# Patient Record
Sex: Male | Born: 1970 | Race: Black or African American | Hispanic: No | Marital: Single | Smoking: Former smoker
Health system: Southern US, Community
[De-identification: ages and names within clinical notes are randomized; demographics above are authoritative.]

---

## 2012-01-24 ENCOUNTER — Encounter (HOSPITAL_COMMUNITY): Payer: Self-pay | Admitting: Emergency Medicine

## 2012-01-24 ENCOUNTER — Emergency Department (HOSPITAL_COMMUNITY)
Admission: EM | Admit: 2012-01-24 | Discharge: 2012-01-24 | Disposition: A | Discharge status: Home / Self Care | Payer: Self-pay | Attending: Emergency Medicine | Admitting: Emergency Medicine

## 2012-01-24 DIAGNOSIS — M549 Dorsalgia, unspecified: Secondary | ICD-10-CM | POA: Insufficient documentation

## 2012-01-24 DIAGNOSIS — R209 Unspecified disturbances of skin sensation: Secondary | ICD-10-CM | POA: Insufficient documentation

## 2012-01-24 MED ORDER — OXYCODONE-ACETAMINOPHEN 5-325 MG PO TABS: 2.0000 | ORAL_TABLET | ORAL | Status: DC | PRN

## 2012-01-24 MED ORDER — IBUPROFEN 600 MG PO TABS: 600.0000 mg | ORAL_TABLET | Freq: Four times a day (QID) | ORAL | Status: DC | PRN

## 2012-01-24 NOTE — ED Notes (Signed)
Pt states that he picked up a suitcase last week and is still having lower back pain. Also states that he hasn't been sleeping well. Hx of back problems.

## 2012-01-24 NOTE — ED Notes (Signed)
Pt picked up suitcase last week and threw back out. Pt has been taking ibuprofen for pain with no results. Pt complains if he sits to long pain becomes worse. Pt complains of pain across lower part of back. Pt has hx of back pain. In the past it has taken a week to have back feel better.

## 2012-01-24 NOTE — ED Provider Notes (Signed)
Medical screening examination/treatment/procedure(s) were performed by non-physician practitioner and as supervising physician I was immediately available for consultation/collaboration.   Gwyneth Sprout, MD 01/24/12 [phone]

## 2012-01-24 NOTE — ED Provider Notes (Signed)
History     CSN: [id number]  Arrival date & time 01/24/12  1327   First MD Initiated Contact with Patient 01/24/12 1707      Chief Complaint  Patient presents with  . Back Pain    (Consider location/radiation/quality/duration/timing/severity/associated sxs/prior treatment) Patient is a 41 y.o. male presenting with back pain. The history is provided by the patient. No language interpreter was used.  Back Pain  This is a new problem. The current episode started more than 2 days ago. The problem has not changed since onset.The pain is associated with lifting heavy objects. Associated symptoms include numbness. Pertinent negatives include no weakness.   bilateral lumbar  paraspinal pain since last week when he lifted a suitcase.  Patient complains of pain 8/10 today that has not worsened. States that  The pain has gotten better but he had to stay out of work today because it hurts in bed. States that he has taken over-the-counter medications like ibuprofen with some relief. No bowel or bladder incontinence no radiation of pain. States that he injured his back before in the same way and it took a week to get better. Patient has no PCP.  History reviewed. No pertinent past medical history.  History reviewed. No pertinent past surgical history.  No family history on file.  History  Substance Use Topics  . Smoking status: Never Smoker   . Smokeless tobacco: Not on file  . Alcohol Use: No      Review of Systems  Constitutional: Negative.   HENT: Negative.   Eyes: Negative.   Respiratory: Negative.   Cardiovascular: Negative.   Gastrointestinal: Negative.  Negative for nausea and vomiting.  Genitourinary: Negative for difficulty urinating.  Musculoskeletal: Positive for back pain.  Neurological: Positive for numbness. Negative for dizziness and weakness.  Psychiatric/Behavioral: Negative.   All other systems reviewed and are negative.    Allergies  Review of patient's  allergies indicates no known allergies.  Home Medications  No current outpatient prescriptions on file.  BP 129/84  Pulse 95  Temp 98.1 F (36.7 C)  Resp 16  SpO2 100%  Physical Exam  Nursing note and vitals reviewed. Constitutional: He is oriented to person, place, and time. He appears well-developed and well-nourished.  HENT:  Head: Normocephalic.  Eyes: Conjunctivae normal and EOM are normal. Pupils are equal, round, and reactive to light.  Neck: Normal range of motion. Neck supple.  Cardiovascular: Normal rate.   Pulmonary/Chest: Effort normal.  Abdominal: Soft.  Musculoskeletal: Normal range of motion.  Neurological: He is alert and oriented to person, place, and time. He has normal strength and normal reflexes. No cranial nerve deficit or sensory deficit. He displays a negative Romberg sign. Coordination normal. GCS eye subscore is 4. GCS verbal subscore is 5. GCS motor subscore is 6.  Skin: Skin is warm and dry.  Psychiatric: He has a normal mood and affect.    ED Course  Procedures (including critical care time)  Labs Reviewed - No data to display No results found.   No diagnosis found.    MDM  Bilateral lower back pain since lifting a suitcase last week with no radiation of pain. No cauda equina symptoms or red flags. Patient understands to return for worsening symptoms. Followup with PCP of choice.        Remi Haggard, NP 01/24/12 1842

## 2012-04-17 ENCOUNTER — Emergency Department (HOSPITAL_COMMUNITY)
Admission: EM | Admit: 2012-04-17 | Discharge: 2012-04-17 | Disposition: A | Discharge status: Home / Self Care | Payer: Self-pay | Attending: Emergency Medicine | Admitting: Emergency Medicine

## 2012-04-17 ENCOUNTER — Encounter (HOSPITAL_COMMUNITY): Payer: Self-pay

## 2012-04-17 DIAGNOSIS — R042 Hemoptysis: Secondary | ICD-10-CM | POA: Insufficient documentation

## 2012-04-17 DIAGNOSIS — Z87891 Personal history of nicotine dependence: Secondary | ICD-10-CM | POA: Insufficient documentation

## 2012-04-17 NOTE — ED Provider Notes (Addendum)
History     CSN: [id number]  Arrival date & time 04/17/12  4098   First MD Initiated Contact with Patient 04/17/12 [phone]      Chief Complaint  Patient presents with  . Hemoptysis    (Consider location/radiation/quality/duration/timing/severity/associated sxs/prior treatment) HPI Comments: Patient woke from sleep this morning to go to the restroom.  He then spit into the toilet and noted that there was a small amount of dark blood.  He did not think much of it, but when he told his family, they insisted he come here to be evaluated.  He denies any fevers, chills, congestion, cough, etc.  He otherwise feels fine.  This has never happened before.  The history is provided by the patient.    History reviewed. No pertinent past medical history.  History reviewed. No pertinent past surgical history.  Family History  Problem Relation Age of Onset  . Diabetes Mother   . Hypertension Mother   . Thyroid disease Mother     History  Substance Use Topics  . Smoking status: Former Games developer  . Smokeless tobacco: Never Used  . Alcohol Use: No      Review of Systems  All other systems reviewed and are negative.    Allergies  Review of patient's allergies indicates no known allergies.  Home Medications   Current Outpatient Rx  Name  Route  Sig  Dispense  Refill  . IBUPROFEN 600 MG PO TABS   Oral   Take 1 tablet (600 mg total) by mouth every 6 (six) hours as needed for pain.   30 tablet   0   . OXYCODONE-ACETAMINOPHEN 5-325 MG PO TABS   Oral   Take 2 tablets by mouth every 4 (four) hours as needed for pain.   15 tablet   0     BP 141/88  Pulse 91  Temp 98.4 F (36.9 C) (Oral)  Resp 18  SpO2 100%  Physical Exam  Nursing note and vitals reviewed. Constitutional: He is oriented to person, place, and time. He appears well-developed and well-nourished. No distress.  HENT:  Head: Normocephalic and atraumatic.  Nose: Nose normal.  Mouth/Throat: Oropharynx is clear and  moist. No oropharyngeal exudate.       Bilateral tm's clear.  No maxillofacial ttp.  Neck: Normal range of motion. Neck supple.  Cardiovascular: Normal rate and regular rhythm.   No murmur heard. Pulmonary/Chest: Effort normal and breath sounds normal. No respiratory distress.  Lymphadenopathy:    He has no cervical adenopathy.  Neurological: He is alert and oriented to person, place, and time.  Skin: Skin is warm and dry. He is not diaphoretic.    ED Course  Procedures (including critical care time)  Labs Reviewed - No data to display No results found.   No diagnosis found.    MDM  He appears well with no complaints.  I am unable to visualize any bleeding or abnormality on the physical exam.  I suspect that this is likely related to dry mucous membranes and I have recommended using a humidifier at night.  He is to return to the ER if symptoms worsen and follow up with his pcp to discuss ENT exam if symptoms persist.  No emergent condition is apparent.  There is no chest pain or shortness of breath and nothing in the vitals or exam to suggest pe.        Geoffery Lyons, MD 04/17/12 4782  Geoffery Lyons, MD 04/17/12 [phone]

## 2012-04-17 NOTE — ED Notes (Signed)
Patient states he saw blood when he spit last night.  Patient denies any other associated symptoms.

## 2012-04-17 NOTE — ED Notes (Signed)
Patient reports that he had a medium amount of dark blood when he spit. Patient  Denies this happening when he coughed.

## 2012-09-11 ENCOUNTER — Emergency Department (HOSPITAL_COMMUNITY)
Admission: EM | Admit: 2012-09-11 | Discharge: 2012-09-11 | Disposition: A | Discharge status: Home / Self Care | Payer: Self-pay | Attending: Emergency Medicine | Admitting: Emergency Medicine

## 2012-09-11 ENCOUNTER — Encounter (HOSPITAL_COMMUNITY): Payer: Self-pay | Admitting: *Deleted

## 2012-09-11 DIAGNOSIS — Z87891 Personal history of nicotine dependence: Secondary | ICD-10-CM | POA: Insufficient documentation

## 2012-09-11 DIAGNOSIS — R112 Nausea with vomiting, unspecified: Secondary | ICD-10-CM | POA: Insufficient documentation

## 2012-09-11 MED ORDER — ONDANSETRON 8 MG PO TBDP
8.0000 mg | ORAL_TABLET | Freq: Once | ORAL | Status: AC
  Administered 2012-09-11: 8 mg via ORAL
  Filled 2012-09-11: qty 1

## 2012-09-11 MED ORDER — ONDANSETRON HCL 4 MG/2ML IJ SOLN
4.0000 mg | Freq: Once | INTRAMUSCULAR | Status: DC
  Filled 2012-09-11: qty 2

## 2012-09-11 MED ORDER — SODIUM CHLORIDE 0.9 % IV SOLN: 1000.0000 mL | INTRAVENOUS | Status: DC

## 2012-09-11 MED ORDER — ONDANSETRON 8 MG PO TBDP: 8.0000 mg | ORAL_TABLET | Freq: Three times a day (TID) | ORAL | Status: DC | PRN

## 2012-09-11 MED ORDER — SODIUM CHLORIDE 0.9 % IV SOLN: 1000.0000 mL | Freq: Once | INTRAVENOUS | Status: DC

## 2012-09-11 NOTE — ED Provider Notes (Signed)
History     CSN: [id number]  Arrival date & time 09/11/12  7829   First MD Initiated Contact with Patient 09/11/12 [phone]      Chief Complaint  Patient presents with  . Emesis     The history is provided by the patient.   patient reports developing nausea vomiting since last night.  No hematemesis.  No melena or hematochezia.  2 loose stools.  No prior history of similar.  Denies crampy abdominal pain.  No fevers or chills.  No sore throat.  No cough or congestion.  No shortness of breath.  Symptoms are mild to moderate in severity.  Try to drink orange juice this morning and was unable to keep it down.  History reviewed. No pertinent past medical history.  History reviewed. No pertinent past surgical history.  Family History  Problem Relation Age of Onset  . Diabetes Mother   . Hypertension Mother   . Thyroid disease Mother     History  Substance Use Topics  . Smoking status: Former Games developer  . Smokeless tobacco: Never Used  . Alcohol Use: No      Review of Systems  Gastrointestinal: Positive for vomiting.  All other systems reviewed and are negative.    Allergies  Review of patient's allergies indicates no known allergies.  Home Medications   Current Outpatient Rx  Name  Route  Sig  Dispense  Refill  . ibuprofen (ADVIL,MOTRIN) 600 MG tablet   Oral   Take 1 tablet (600 mg total) by mouth every 6 (six) hours as needed for pain.   30 tablet   0   . oxyCODONE-acetaminophen (PERCOCET/ROXICET) 5-325 MG per tablet   Oral   Take 2 tablets by mouth every 4 (four) hours as needed for pain.   15 tablet   0     BP 130/89  Pulse 87  Temp(Src) 98 F (36.7 C) (Temporal)  Resp 18  Ht 5\' 10"  (1.778 m)  Wt 41 lb 4 oz (18.711 kg)  BMI 5.92 kg/m2  SpO2 100%  Physical Exam  Nursing note and vitals reviewed. Constitutional: He is oriented to person, place, and time. He appears well-developed and well-nourished.  HENT:  Head: Normocephalic and atraumatic.  Eyes:  EOM are normal.  Neck: Normal range of motion.  Cardiovascular: Normal rate, regular rhythm, normal heart sounds and intact distal pulses.   Pulmonary/Chest: Effort normal and breath sounds normal. No respiratory distress.  Abdominal: Soft. He exhibits no distension. There is no tenderness.  Genitourinary: Rectum normal.  Musculoskeletal: Normal range of motion.  Neurological: He is alert and oriented to person, place, and time.  Skin: Skin is warm and dry.  Psychiatric: He has a normal mood and affect. Judgment normal.    ED Course  Procedures (including critical care time)  Labs Reviewed - No data to display No results found.   1. Nausea & vomiting       MDM  8:05 AM Patient does not want IV fluids or IV Zofran.  He requests oral Zofran discharge home.  DC home in good condition.  Abdominal exam is benign.  Vital signs are normal.        Lyanne Co, MD 09/11/12 [phone]

## 2012-09-11 NOTE — ED Notes (Signed)
PT c/o n/v x 2 yesterday and x 1 this morning.

## 2014-02-27 ENCOUNTER — Encounter (HOSPITAL_COMMUNITY): Payer: Self-pay

## 2014-02-27 ENCOUNTER — Emergency Department (HOSPITAL_COMMUNITY)
Admission: EM | Admit: 2014-02-27 | Discharge: 2014-02-27 | Disposition: A | Discharge status: Home / Self Care | Payer: Self-pay | Attending: Emergency Medicine | Admitting: Emergency Medicine

## 2014-02-27 DIAGNOSIS — Z791 Long term (current) use of non-steroidal anti-inflammatories (NSAID): Secondary | ICD-10-CM | POA: Insufficient documentation

## 2014-02-27 DIAGNOSIS — J069 Acute upper respiratory infection, unspecified: Secondary | ICD-10-CM | POA: Insufficient documentation

## 2014-02-27 DIAGNOSIS — Z87891 Personal history of nicotine dependence: Secondary | ICD-10-CM | POA: Insufficient documentation

## 2014-02-27 MED ORDER — OXYMETAZOLINE HCL 0.05 % NA SOLN: 1.0000 | Freq: Two times a day (BID) | NASAL | Status: DC

## 2014-02-27 MED ORDER — HYDROCOD POLST-CHLORPHEN POLST 10-8 MG/5ML PO LQCR: 5.0000 mL | Freq: Two times a day (BID) | ORAL | Status: DC | PRN

## 2014-02-27 NOTE — ED Notes (Signed)
Pt presents with c/o cough and nasal congestion for the past 2 days. Pt denies any abdominal pain or vomiting. Pt reports some intermittent weakness with his symptoms but denies weakness at this time.

## 2014-02-27 NOTE — ED Notes (Signed)
Initial Contact - pt reports cough/cold symp x3-4 days, "feeling terrible", denies cp/sob, speaking full/clear sentences, rr even/un-lab.  C/o 4/10 headache at this time.  Skin PWD.  NAD.

## 2014-02-27 NOTE — Discharge Instructions (Signed)
Read the information below.  Use the prescribed medication as directed.  Please discuss all new medications with your pharmacist.  You may return to the Emergency Department at any time for worsening condition or any new symptoms that concern you.  If you develop high fevers that do not resolve with tylenol or ibuprofen, you have difficulty swallowing or breathing, or you are unable to tolerate fluids by mouth, return to the ER for a recheck.    ° ° °Upper Respiratory Infection, Adult °An upper respiratory infection (URI) is also sometimes known as the common cold. The upper respiratory tract includes the nose, sinuses, throat, trachea, and bronchi. Bronchi are the airways leading to the lungs. Most people improve within 1 week, but symptoms can last up to 2 weeks. A residual cough may last even longer.  °CAUSES °Many different viruses can infect the tissues lining the upper respiratory tract. The tissues become irritated and inflamed and often become very moist. Mucus production is also common. A cold is contagious. You can easily spread the virus to others by oral contact. This includes kissing, sharing a glass, coughing, or sneezing. Touching your mouth or nose and then touching a surface, which is then touched by another person, can also spread the virus. °SYMPTOMS  °Symptoms typically develop 1 to 3 days after you come in contact with a cold virus. Symptoms vary from person to person. They may include: °· Runny nose. °· Sneezing. °· Nasal congestion. °· Sinus irritation. °· Sore throat. °· Loss of voice (laryngitis). °· Cough. °· Fatigue. °· Muscle aches. °· Loss of appetite. °· Headache. °· Low-grade fever. °DIAGNOSIS  °You might diagnose your own cold based on familiar symptoms, since most people get a cold 2 to 3 times a year. Your caregiver can confirm this based on your exam. Most importantly, your caregiver can check that your symptoms are not due to another disease such as strep throat, sinusitis,  pneumonia, asthma, or epiglottitis. Blood tests, throat tests, and X-rays are not necessary to diagnose a common cold, but they may sometimes be helpful in excluding other more serious diseases. Your caregiver will decide if any further tests are required. °RISKS AND COMPLICATIONS  °You may be at risk for a more severe case of the common cold if you smoke cigarettes, have chronic heart disease (such as heart failure) or lung disease (such as asthma), or if you have a weakened immune system. The very young and very old are also at risk for more serious infections. Bacterial sinusitis, middle ear infections, and bacterial pneumonia can complicate the common cold. The common cold can worsen asthma and chronic obstructive pulmonary disease (COPD). Sometimes, these complications can require emergency medical care and may be life-threatening. °PREVENTION  °The best way to protect against getting a cold is to practice good hygiene. Avoid oral or hand contact with people with cold symptoms. Wash your hands often if contact occurs. There is no clear evidence that vitamin C, vitamin E, echinacea, or exercise reduces the chance of developing a cold. However, it is always recommended to get plenty of rest and practice good nutrition. °TREATMENT  °Treatment is directed at relieving symptoms. There is no cure. Antibiotics are not effective, because the infection is caused by a virus, not by bacteria. Treatment may include: °· Increased fluid intake. Sports drinks offer valuable electrolytes, sugars, and fluids. °· Breathing heated mist or steam (vaporizer or shower). °· Eating chicken soup or other clear broths, and maintaining good nutrition. °· Getting plenty   of rest. °· Using gargles or lozenges for comfort. °· Controlling fevers with ibuprofen or acetaminophen as directed by your caregiver. °· Increasing usage of your inhaler if you have asthma. °Zinc gel and zinc lozenges, taken in the first 24 hours of the common cold, can  shorten the duration and lessen the severity of symptoms. Pain medicines may help with fever, muscle aches, and throat pain. A variety of non-prescription medicines are available to treat congestion and runny nose. Your caregiver can make recommendations and may suggest nasal or lung inhalers for other symptoms.  °HOME CARE INSTRUCTIONS  °· Only take over-the-counter or prescription medicines for pain, discomfort, or fever as directed by your caregiver. °· Use a warm mist humidifier or inhale steam from a shower to increase air moisture. This may keep secretions moist and make it easier to breathe. °· Drink enough water and fluids to keep your urine clear or pale yellow. °· Rest as needed. °· Return to work when your temperature has returned to normal or as your caregiver advises. You may need to stay home longer to avoid infecting others. You can also use a face mask and careful hand washing to prevent spread of the virus. °SEEK MEDICAL CARE IF:  °· After the first few days, you feel you are getting worse rather than better. °· You need your caregiver's advice about medicines to control symptoms. °· You develop chills, worsening shortness of breath, or brown or red sputum. These may be signs of pneumonia. °· You develop yellow or brown nasal discharge or pain in the face, especially when you bend forward. These may be signs of sinusitis. °· You develop a fever, swollen neck glands, pain with swallowing, or white areas in the back of your throat. These may be signs of strep throat. °SEEK IMMEDIATE MEDICAL CARE IF:  °· You have a fever. °· You develop severe or persistent headache, ear pain, sinus pain, or chest pain. °· You develop wheezing, a prolonged cough, cough up blood, or have a change in your usual mucus (if you have chronic lung disease). °· You develop sore muscles or a stiff neck. °Document Released: 09/01/2000 Document Revised: 05/31/2011 Document Reviewed: 06/13/2013 °ExitCare® Patient Information ©2015  [employer]. This information is not intended to replace advice given to you by your health care provider. Make sure you discuss any questions you have with your health care provider. ° ° ° °Emergency Department Resource Guide °1) Find a Doctor and Pay Out of Pocket °Although you won't have to find out who is covered by your insurance plan, it is a good idea to ask around and get recommendations. You will then need to call the office and see if the doctor you have chosen will accept you as a new patient and what types of options they offer for patients who are self-pay. Some doctors offer discounts or will set up payment plans for their patients who do not have insurance, but you will need to ask so you aren't surprised when you get to your appointment. ° °2) Contact Your Local Health Department °Not all health departments have doctors that can see patients for sick visits, but many do, so it is worth a call to see if yours does. If you don't know where your local health department is, you can check in your phone book. The CDC also has a tool to help you locate your state's health department, and many state websites also have listings of all of their local health departments. ° °  3) Find a Walk-in Clinic °If your illness is not likely to be very severe or complicated, you may want to try a walk in clinic. These are popping up all over the country in pharmacies, drugstores, and shopping centers. They're usually staffed by nurse practitioners or physician assistants that have been trained to treat common illnesses and complaints. They're usually fairly quick and inexpensive. However, if you have serious medical issues or chronic medical problems, these are probably not your best option. ° °No Primary Care Doctor: °- Call [facility] at  832-8000 - they can help you locate a primary care doctor that  accepts your insurance, provides certain services, etc. °- Physician Referral Service- 1-800-533-3463 ° °Chronic  Pain Problems: °Organization         Address  Phone   Notes  °[facility]  (336) 297-2271 Patients need to be referred by their primary care doctor.  ° °Medication Assistance: °Organization         Address  Phone   Notes  °[place] [facility] [address] °[place], [place] [address] (336) 641-8030 --Must be a resident of [place] °-- Must have NO insurance coverage whatsoever (no Medicaid/ Medicare, etc.) °-- The pt. MUST have a primary care doctor that directs their care regularly and follows them in the community °  °[facility]  (866) 331-1348   °[employer]  (888) 892-1162   ° °Agencies that provide inexpensive medical care: °Organization         Address  Phone   Notes  °[facility]  (336) 832-8035   °[facility]    (336) 832-7272   °[facility] [address] °[place], [place] [address] (336) 832-4777   °[facility] [address], °[place] (336) 271-4999   °[facility]    (336) 373-0678   °[facility]    (336) 272-1050   °[facility] ° [address], [place] Phone:  (336) 832-4444, Fax:  (336) 832-4440 Hours of Operation:  9 am - 6 pm, M-F.  Also accepts Medicaid/Medicare and self-pay.  °[facility] ° [address], [place] Phone: (336) 832-3150, Fax: (336) 832-3151. Hours of Operation:  8:30 am - 5:30 pm, M-F.  Also accepts Medicaid and self-pay.  °[facility] [place] [address], [place] Phone: (336) 878-6027   °[facility] [address], [place], [place] (336)723-1848, Ext. 123 Mondays & Thursdays: 7-9 AM.  First 15 patients are seen on a first come, first serve basis. °  ° °Medicaid-accepting [place] Providers: ° °Organization         Address  Phone   Notes  °[facility] [address], [place] (336) 641-2100 Also  accepts self-pay patients.  °[facility] [address], [place] ° (336) 856-9996   °[facility] [address], [place] (336) 288-8857   °[facility] [address] [place] [address], [place] (336) 299-7000   °Veita Bland [address],   ° (336) 373-1557 Only accepts [place] Access Medicaid patients after they have their name applied to their card.  ° °Self-Pay (no insurance) in [place]: ° °Organization         Address  Phone   Notes  °Sickle Cell Patients, [facility] [address], [place] (336) 832-1970   °[facility] [address], [place] (336) 832-4400   °[facility] ° [address], [place] (336) 992-4800   °[facility]/Dr. Osei-Bonsu ° [address] [place] [address], [place] or [address], [place] (336) 841-8500 Phone number for both [place] and [place] locations is the same.  °[facility] [address], [place] (336) 299-0000   °[facility] [address] [place] [address], [place] or [address] (336) 852-7530 °(336) 878-2260   °[facility] [address], [place] (336) 350-1642, phone; (336) 294-5005, fax Sees patients 1st and 3rd Saturday of every month.  Must not qualify for public or private insurance (i.e. Medicaid, Medicare, [place] Health Choice, Veterans' Benefits) • Household income should be no more than 200% of the poverty level •The clinic cannot treat you if you are pregnant or think you are pregnant • Sexually transmitted diseases are not treated at the clinic.  ° ° °Dental Care: °Organization         Address  Phone  Notes  °[facility] [address], [place] (336) 641-6152 Accepts children up to age 21 who are enrolled in Medicaid or [place] Health Choice; pregnant  women with a Medicaid card; and children who have applied for Medicaid or [place] Health Choice, but were declined, whose parents can pay a reduced fee at time of service.  °[facility] [place]  [address], [place] (336) 641-7733 Accepts children up to age 21 who are enrolled in Medicaid or [place] Health Choice; pregnant women with a Medicaid card; and children who have applied for Medicaid or [place] Health Choice, but were declined, whose parents can pay a reduced fee at time of service.  °[facility] ° [address], [place] (336) 641-4533 Patients are seen by appointment only. Walk-ins are not accepted. [facility] will see patients 18 years of age and older. °Monday - Tuesday (8am-5pm) °Most Wednesdays (8:30-5pm) °$30 per visit, cash only  °[facility] ° [address], [place] (336) 641-4533 Patients are seen by appointment only. Walk-ins are not accepted. [facility] will see patients 18 years of age and older. °One Wednesday Evening (Monthly: Volunteer Based).  $30 per visit, cash only  °[facility]  (919) 537-3737 for adults; Children under age 4, call Graduate Pediatric Dentistry at (919) [phone]. Children aged 4-14, please call (919) [phone] to request a pediatric application. ° Dental services are provided in all areas of dental care including fillings, crowns and bridges, complete and partial dentures, implants, gum treatment, root canals, and extractions. Preventive care is also provided. Treatment is provided to both adults and children. °Patients are selected via a lottery and there is often a waiting list. °  °[facility] [address], °[place] ° (336) 763-8833 [email or web address] °  °[facility] [address], [place], [place] (336)723-1848, Ext. 123 Second and Fourth Thursday of each month, opens at 6:30 AM; Clinic ends at 9 AM.  Patients are  seen on a first-come first-served basis, and a limited number are seen during each clinic.  ° °[facility] ° [address], [place], [place] (336) 723-7904   Eligibility Requirements °You must have lived in [place],   [place], or [place] counties for at least the last three months. °  You cannot be eligible for state or federal sponsored healthcare insurance, including Veterans Administration, Medicaid, or Medicare. °  You generally cannot be eligible for healthcare insurance through your employer.  °  How to apply: °Eligibility screenings are held every Tuesday and Wednesday afternoon from 1:00 pm until 4:00 pm. You do not need an appointment for the interview!  °[facility] [address], [place], [place] 336-631-2330   °[facility]  336-342-8273   °[facility]  336-703-3100   °[facility]  336-570-6415   ° °Behavioral Health Resources in the Community: °Intensive Outpatient Programs °Organization         Address  Phone  Notes  °[facility] [address], [place], [place] 336-878-6098   °[facility] [address], [place], [place] 336-832-9800   °[facility] [address], [place], [place] ° 336-882-2125   °[facility] [address],  °[place], [place] 1-800-853-5163 or 336-641-4981   °Substance Abuse Resources °Organization         Address  Phone  Notes  °[facility]  336-882-2125   °[facility]  336-784-9470   °[facility]  336-285-9073   °[facility]  336-845-3988   °Residential & Outpatient Substance Abuse Program  1-800-659-3381   °Psychological Services °Organization         Address  Phone  Notes  °[facility]  336- 832-9600   °[facility]  336- 378-7881   °[facility] [address], [place] 1-800-853-5163 or 336-641-4981   ° °Mobile Crisis  Teams °Organization         Address  Phone  Notes  °[facility], Mobile Crisis Care Unit  1-877-626-1772   °[facility]ive °[facility] ° [address] [place], [place] 336-834-9664   °Sharon DeEsch [address] °[place] 336-554-5454   ° °Self-Help/Support Groups °Organization         Address  Phone             Notes  °[facility] - variety of support groups  336- 373-1402 Call for more information  °Narcotics Anonymous (NA), [facility] [address], °[place]  2 meetings at this location  ° °Residential Treatment Programs °Organization         Address  Phone  Notes  °[facility] [address],    °[place]  1-866-801-8205   °[facility] ° [address], [place], [place] 704-293-8524   °[facility] [address], [place] 336-845-3988 Admissions: 8am-3pm M-F  °[facility] [address]    °[place], [place] 336-841-1104   °[facility] [address], [place], [place] 336-379-7146   °[facility] [address]  °[place], [place] 336-285-9073   °[facility] - Intensive Outpatient [address], [place], [place] 336-852-3033   °[facility] [address]  °[place], [place] 1-877-615-2722 or 336-784-9470   °[facility] [address] [place], [place] 336-227-7417 Accepts Medicaid  °[facility] [address]  °  1-800-659-3381 Substance Abuse/Addiction Treatment  ° °[place] Behavioral Health Resources °Organization         Address  Phone  Notes  °[facility]  (888) 581-9988   °Julie Brannon, PhD [address] [place], [place]   (336) 349-5553 or (336) 951-0000   °[facility]   [address] °[place], [place] (336) 349-4454   °[facility] [address], [place], [place] (336) 342-8316  Insurance/Medicaid/sponsorship through [facility]  °[facility] [address]                                    [place], [place] (336) 342-8316 Therapy/tele-psych/case  °[facility] [address]  ° [place], [place] (336) 349-2233    °Dr. Arfeen  (336) 349-4544   °[facility] [place]  [employer] [place] [facility] 1) [address], [place] °2) [address], [place] °3)  [address], [place] (336) 349-3220 °(336) 342-7768 ° °(336) 342-8140   °[place] [facility] (336) 342-1394 or (336) 342-3537 (After Hours)    ° ° ° °

## 2014-02-27 NOTE — ED Provider Notes (Signed)
CSN: [id number]     Arrival date & time 02/27/14  1439 History  This chart was scribed for non-physician practitioner, Trixie DredgeEmily Winslow Ederer, PA-C, working with Arby BarretteMarcy Pfeiffer, MD by Charline BillsEssence Howell, ED Scribe. This patient was seen in room WTR6/WTR6 and the patient's care was started at 3:36 PM.   Chief Complaint  Patient presents with  . Cough  . Nasal Congestion   The history is provided by the patient. No language interpreter was used.   HPI Comments: Guy Foster is a 43 y.o. male who presents to the Emergency Department complaining of persistent dry cough onset 3 days ago. He reports associated congestion, HA, mild sore throat onset 3 days ago. He denies chest pain, SOB, fever, chills, diaphoresis, abdominal pain, nausea, vomiting, changes in bowels, any urinary symptoms. Pt has been treating with NyQuil without relief. Pt is a nonsmoker. No h/o asthma. No known sick contacts.   History reviewed. No pertinent past medical history. History reviewed. No pertinent past surgical history. Family History  Problem Relation Age of Onset  . Diabetes Mother   . Hypertension Mother   . Thyroid disease Mother    History  Substance Use Topics  . Smoking status: Former Games developermoker  . Smokeless tobacco: Never Used  . Alcohol Use: No    Review of Systems  HENT: Positive for congestion and sore throat.   Respiratory: Positive for cough.   Neurological: Positive for headaches.  All other systems reviewed and are negative.  Allergies  Review of patient's allergies indicates no known allergies.  Home Medications   Prior to Admission medications   Medication Sig Start Date End Date Taking? Authorizing Provider  ibuprofen (ADVIL,MOTRIN) 200 MG tablet Take 200 mg by mouth every 6 (six) hours as needed for pain.    Historical Provider, MD  naproxen sodium (ANAPROX) 220 MG tablet Take 220 mg by mouth 2 (two) times daily with a meal.    Historical Provider, MD  ondansetron (ZOFRAN ODT) 8 MG disintegrating  tablet Take 1 tablet (8 mg total) by mouth every 8 (eight) hours as needed for nausea. 09/11/12   Lyanne CoKevin M Campos, MD   Triage Vitals: BP 141/84 mmHg  Pulse 74  Temp(Src) 98.3 F (36.8 C) (Oral)  Resp 12  Ht 5\' 10"  (1.778 m)  Wt 130 lb (58.968 kg)  BMI 18.65 kg/m2  SpO2 97% Physical Exam  Constitutional: He appears well-developed and well-nourished. No distress.  HENT:  Head: Normocephalic and atraumatic.  Mouth/Throat: Oropharynx is clear and moist. No oropharyngeal exudate.  Eyes: Conjunctivae and EOM are normal. Right eye exhibits no discharge. Left eye exhibits no discharge.  Neck: Normal range of motion. Neck supple.  Cardiovascular: Normal rate and regular rhythm.   Pulmonary/Chest: Effort normal and breath sounds normal. No stridor. No respiratory distress. He has no wheezes. He has no rales.  Lymphadenopathy:    He has no cervical adenopathy.  Neurological: He is alert.  Skin: He is not diaphoretic.  Nursing note and vitals reviewed.  ED Course  Procedures (including critical care time) DIAGNOSTIC STUDIES: Oxygen Saturation is 97% on RA, normal by my interpretation.    COORDINATION OF CARE: 3:39 PM-Discussed treatment plan which includes Afrin nasal spray and Tussionex with pt at bedside and pt agreed to plan.   Labs Review Labs Reviewed - No data to display  Imaging Review No results found.   EKG Interpretation None      MDM   Final diagnoses:  URI (upper respiratory infection)  Afebrile, nontoxic patient with constellation of symptoms suggestive of viral syndrome.  No concerning findings on exam.  Discharged home with supportive care, PCP follow up.  Discussed findings, treatment, and follow up  with patient.  Pt given return precautions.  Pt verbalizes understanding and agrees with plan.      I personally performed the services described in this documentation, which was scribed in my presence. The recorded information has been reviewed and is  accurate.     Trixie Dredgemily Jonell Krontz, PA-C 02/27/14 1633  Arby BarretteMarcy Pfeiffer, MD 02/28/14 1754

## 2014-03-28 ENCOUNTER — Encounter (HOSPITAL_COMMUNITY): Payer: Self-pay

## 2014-03-28 ENCOUNTER — Emergency Department (HOSPITAL_COMMUNITY): Payer: Self-pay

## 2014-03-28 ENCOUNTER — Emergency Department (HOSPITAL_COMMUNITY)
Admission: EM | Admit: 2014-03-28 | Discharge: 2014-03-28 | Disposition: A | Discharge status: Home / Self Care | Payer: Self-pay | Attending: Emergency Medicine | Admitting: Emergency Medicine

## 2014-03-28 DIAGNOSIS — R0789 Other chest pain: Secondary | ICD-10-CM

## 2014-03-28 DIAGNOSIS — R05 Cough: Secondary | ICD-10-CM

## 2014-03-28 DIAGNOSIS — Z87891 Personal history of nicotine dependence: Secondary | ICD-10-CM | POA: Insufficient documentation

## 2014-03-28 DIAGNOSIS — Z79899 Other long term (current) drug therapy: Secondary | ICD-10-CM | POA: Insufficient documentation

## 2014-03-28 DIAGNOSIS — R071 Chest pain on breathing: Secondary | ICD-10-CM | POA: Insufficient documentation

## 2014-03-28 DIAGNOSIS — Z791 Long term (current) use of non-steroidal anti-inflammatories (NSAID): Secondary | ICD-10-CM | POA: Insufficient documentation

## 2014-03-28 DIAGNOSIS — R059 Cough, unspecified: Secondary | ICD-10-CM

## 2014-03-28 MED ORDER — NAPROXEN 500 MG PO TABS: 500.0000 mg | ORAL_TABLET | Freq: Two times a day (BID) | ORAL | Status: DC

## 2014-03-28 MED ORDER — NAPROXEN 500 MG PO TABS
500.0000 mg | ORAL_TABLET | Freq: Once | ORAL | Status: AC
  Administered 2014-03-28: 500 mg via ORAL
  Filled 2014-03-28: qty 1

## 2014-03-28 NOTE — ED Provider Notes (Signed)
CSN: [id number]     Arrival date & time 03/28/14  2116 History   First MD Initiated Contact with Patient 03/28/14 2219    This chart was scribed for non-physician practitioner, Antony MaduraKelly Coco Sharpnack, PA, working with Dr. Rolland PorterMark James by Marica OtterNusrat Rahman, ED Scribe. This patient was seen in room WTR7/WTR7 and the patient's care was started at 10:37 PM.  Chief Complaint  Patient presents with  . Flank Pain   Patient is a 44 y.o. male presenting with flank pain. The history is provided by the patient. No language interpreter was used.  Flank Pain This is a new problem. The current episode started more than 2 days ago. The problem occurs hourly. The problem has not changed since onset.Pertinent negatives include no abdominal pain and no shortness of breath. Nothing aggravates the symptoms. Treatments tried: mucinex    PCP: No primary care provider on file. HPI Comments: Guy Foster is a 44 y.o. male who presents to the Emergency Department complaining of atraumatic, sudden onset, intermittent rib pain onset 1 week ago. Pt reports that the pain is worse when he coughs or sneezes. Pt rates his present pain an 8 out of 10. Pt denies SOB, fever, chills, n/v/d, abd pain, LOC.   History reviewed. No pertinent past medical history. History reviewed. No pertinent past surgical history. Family History  Problem Relation Age of Onset  . Diabetes Mother   . Hypertension Mother   . Thyroid disease Mother    History  Substance Use Topics  . Smoking status: Former Games developermoker  . Smokeless tobacco: Never Used  . Alcohol Use: No    Review of Systems  Constitutional: Negative for fever and chills.  Respiratory: Negative for shortness of breath.   Gastrointestinal: Negative for nausea, vomiting, abdominal pain and diarrhea.  Musculoskeletal:       Rib pain   Psychiatric/Behavioral: Negative for confusion.  All other systems reviewed and are negative.   Allergies  Review of patient's allergies indicates no known  allergies.  Home Medications   Prior to Admission medications   Medication Sig Start Date End Date Taking? Authorizing Provider  chlorpheniramine-HYDROcodone (TUSSIONEX PENNKINETIC ER) 10-8 MG/5ML LQCR Take 5 mLs by mouth every 12 (twelve) hours as needed for cough (and pain). 02/27/14   Trixie DredgeEmily West, PA-C  ibuprofen (ADVIL,MOTRIN) 200 MG tablet Take 200 mg by mouth every 6 (six) hours as needed for pain.    Historical Provider, MD  naproxen (NAPROSYN) 500 MG tablet Take 1 tablet (500 mg total) by mouth 2 (two) times daily. 03/28/14   Antony MaduraKelly Dock Baccam, PA-C  naproxen sodium (ANAPROX) 220 MG tablet Take 220 mg by mouth 2 (two) times daily with a meal.    Historical Provider, MD  ondansetron (ZOFRAN ODT) 8 MG disintegrating tablet Take 1 tablet (8 mg total) by mouth every 8 (eight) hours as needed for nausea. 09/11/12   Lyanne CoKevin M Campos, MD  oxymetazoline Scnetx(AFRIN NASAL SPRAY) 0.05 % nasal spray Place 1 spray into both nostrils 2 (two) times daily. X 3 days only 02/27/14   Trixie DredgeEmily West, PA-C   Triage Vitals: BP 133/77 mmHg  Pulse 77  Temp(Src) 98.5 F (36.9 C) (Oral)  Resp 16  SpO2 97%  Physical Exam  Constitutional: He is oriented to person, place, and time. He appears well-developed and well-nourished. No distress.  Nontoxic/nonseptic appearing  HENT:  Head: Normocephalic and atraumatic.  Eyes: Conjunctivae and EOM are normal. No scleral icterus.  Neck: Normal range of motion. Neck supple.  Cardiovascular: Normal  rate, regular rhythm and normal heart sounds.   Pulmonary/Chest: Effort normal and breath sounds normal. No respiratory distress. He has no wheezes. He has no rales.  Lungs clear to auscultation bilaterally. Respirations even and unlabored.  Abdominal: Soft. He exhibits no distension. There is no tenderness.  Soft, nontender  Musculoskeletal: Normal range of motion.  Neurological: He is alert and oriented to person, place, and time. He exhibits normal muscle tone. Coordination normal.  GCS  15. Patient moves extremities without ataxia.  Skin: Skin is warm and dry. No rash noted. He is not diaphoretic. No erythema. No pallor.  Psychiatric: He has a normal mood and affect. His behavior is normal.  Nursing note and vitals reviewed.   ED Course  Procedures (including critical care time) DIAGNOSTIC STUDIES: Oxygen Saturation is 97% on RA, normal by my interpretation.    COORDINATION OF CARE: 10:40 PM-Discussed treatment plan with pt at bedside and pt agreed to plan.   Labs Review Labs Reviewed - No data to display  Imaging Review Dg Chest 2 View  03/28/2014   CLINICAL DATA:  Bilateral rib pain with cough or sneeze, especially on the right side. Cold symptoms 2 weeks ago. Cough.  EXAM: CHEST  2 VIEW  COMPARISON:  None.  FINDINGS: Pulmonary hyperinflation. The heart size and mediastinal contours are within normal limits. Both lungs are clear. The visualized skeletal structures are unremarkable.  IMPRESSION: No active cardiopulmonary disease.   Electronically Signed   By: Burman Nieves M.D.   On: 03/28/2014 21:54     EKG Interpretation None      MDM   Final diagnoses:  Costochondral chest pain    44 year old male presents to the emergency department for chest wall pain present only when coughing or sneezing. Patient had upper respiratory symptoms with cough and congestion with onset 2 weeks ago. The symptoms all the 1 week ago which is when chest wall pain began. Symptoms consistent with costochondritis. Chest x-ray negative for rib fracture, focal consolidation, or pneumonia. No evidence of pneumothorax. Have advised supportive treatment with naproxen. Return precautions discussed and provided. Patient agreeable to plan with no unaddressed concerns. Patient discharged in good condition; VSS.  I personally performed the services described in this documentation, which was scribed in my presence. The recorded information has been reviewed and is accurate.   Filed Vitals:    03/28/14 2131  BP: 133/77  Pulse: 77  Temp: 98.5 F (36.9 C)  TempSrc: Oral  Resp: 16  SpO2: 97%       Antony Madura, PA-C 03/28/14 2346  Rolland Porter, MD 04/08/14 [id number]

## 2014-03-28 NOTE — ED Notes (Signed)
Pt complains of rib pain when he coughs or sneezes especially on the right side

## 2014-03-28 NOTE — Discharge Instructions (Signed)
Costochondritis Costochondritis, sometimes called Tietze syndrome, is a swelling and irritation (inflammation) of the tissue (cartilage) that connects your ribs with your breastbone (sternum). It causes pain in the chest and rib area. Costochondritis usually goes away on its own over time. It can take up to 6 weeks or longer to get better, especially if you are unable to limit your activities. CAUSES  Some cases of costochondritis have no known cause. Possible causes include:  Injury (trauma).  Exercise or activity such as lifting.  Severe coughing. SIGNS AND SYMPTOMS  Pain and tenderness in the chest and rib area.  Pain that gets worse when coughing or taking deep breaths.  Pain that gets worse with specific movements. DIAGNOSIS  Your health care provider will do a physical exam and ask about your symptoms. Chest X-rays or other tests may be done to rule out other problems. TREATMENT  Costochondritis usually goes away on its own over time. Your health care provider may prescribe medicine to help relieve pain. HOME CARE INSTRUCTIONS   Avoid exhausting physical activity. Try not to strain your ribs during normal activity. This would include any activities using chest, abdominal, and side muscles, especially if heavy weights are used.  Apply ice to the affected area for the first 2 days after the pain begins.  Put ice in a plastic bag.  Place a towel between your skin and the bag.  Leave the ice on for 20 minutes, 2-3 times a day.  Only take over-the-counter or prescription medicines as directed by your health care provider. SEEK MEDICAL CARE IF:  You have redness or swelling at the rib joints. These are signs of infection.  Your pain does not go away despite rest or medicine. SEEK IMMEDIATE MEDICAL CARE IF:   Your pain increases or you are very uncomfortable.  You have shortness of breath or difficulty breathing.  You cough up blood.  You have worse chest pains,  sweating, or vomiting.  You have a fever or persistent symptoms for more than 2-3 days.  You have a fever and your symptoms suddenly get worse. MAKE SURE YOU:   Understand these instructions.  Will watch your condition.  Will get help right away if you are not doing well or get worse. Document Released: 12/16/2004 Document Revised: 12/27/2012 Document Reviewed: 10/10/2012 Exit[employer] Patient Information 2015 [employer]. This information is not intended to replace advice given to you by your health care provider. Make sure you discuss any questions you have with your health care provider.  

## 2014-07-08 ENCOUNTER — Encounter (HOSPITAL_COMMUNITY): Payer: Self-pay | Admitting: Emergency Medicine

## 2014-07-08 ENCOUNTER — Emergency Department (HOSPITAL_COMMUNITY)
Admission: EM | Admit: 2014-07-08 | Discharge: 2014-07-09 | Disposition: A | Discharge status: Home / Self Care | Payer: 59 | Attending: Emergency Medicine | Admitting: Emergency Medicine

## 2014-07-08 DIAGNOSIS — R55 Syncope and collapse: Secondary | ICD-10-CM | POA: Diagnosis present

## 2014-07-08 DIAGNOSIS — Z791 Long term (current) use of non-steroidal anti-inflammatories (NSAID): Secondary | ICD-10-CM | POA: Diagnosis not present

## 2014-07-08 DIAGNOSIS — R42 Dizziness and giddiness: Secondary | ICD-10-CM | POA: Insufficient documentation

## 2014-07-08 DIAGNOSIS — R531 Weakness: Secondary | ICD-10-CM | POA: Insufficient documentation

## 2014-07-08 DIAGNOSIS — Z87891 Personal history of nicotine dependence: Secondary | ICD-10-CM | POA: Diagnosis not present

## 2014-07-08 LAB — CBC
HEMATOCRIT: 42.3 % (ref 39.0–52.0)
Hemoglobin: 14.5 g/dL (ref 13.0–17.0)
MCH: 31.5 pg (ref 26.0–34.0)
MCHC: 34.3 g/dL (ref 30.0–36.0)
MCV: 92 fL (ref 78.0–100.0)
Platelets: 203 10*3/uL (ref 150–400)
RBC: 4.6 MIL/uL (ref 4.22–5.81)
RDW: 12.8 % (ref 11.5–15.5)
WBC: 6.8 10*3/uL (ref 4.0–10.5)

## 2014-07-08 LAB — BASIC METABOLIC PANEL
ANION GAP: 5 (ref 5–15)
BUN: 16 mg/dL (ref 6–23)
CALCIUM: 9.3 mg/dL (ref 8.4–10.5)
CHLORIDE: 101 mmol/L (ref 96–112)
CO2: 27 mmol/L (ref 19–32)
Creatinine, Ser: 1.12 mg/dL (ref 0.50–1.35)
GFR calc non Af Amer: 78 mL/min — ABNORMAL LOW (ref >90)
Glucose, Bld: 102 mg/dL — ABNORMAL HIGH (ref 70–99)
Potassium: 4.4 mmol/L (ref 3.5–5.1)
Sodium: 133 mmol/L — ABNORMAL LOW (ref 135–145)

## 2014-07-08 LAB — CBG MONITORING, ED: GLUCOSE-CAPILLARY: 92 mg/dL (ref 70–99)

## 2014-07-08 NOTE — Discharge Instructions (Signed)
Near-Syncope Near-syncope (commonly known as near fainting) is sudden weakness, dizziness, or feeling like you might pass out. During an episode of near-syncope, you may also develop pale skin, have tunnel vision, or feel sick to your stomach (nauseous). Near-syncope may occur when getting up after sitting or while standing for a long time. It is caused by a sudden decrease in blood flow to the brain. This decrease can result from various causes or triggers, most of which are not serious. However, because near-syncope can sometimes be a sign of something serious, a medical evaluation is required. The specific cause is often not determined. HOME CARE INSTRUCTIONS  Monitor your condition for any changes. The following actions may help to alleviate any discomfort you are experiencing:  Have someone stay with you until you feel stable.  Lie down right away and prop your feet up if you start feeling like you might faint. Breathe deeply and steadily. Wait until all the symptoms have passed. Most of these episodes last only a few minutes. You may feel tired for several hours.   Drink enough fluids to keep your urine clear or pale yellow.   If you are taking blood pressure or heart medicine, get up slowly when seated or lying down. Take several minutes to sit and then stand. This can reduce dizziness.  Follow up with your health care provider as directed. SEEK IMMEDIATE MEDICAL CARE IF:   You have a severe headache.   You have unusual pain in the chest, abdomen, or back.   You are bleeding from the mouth or rectum, or you have black or tarry stool.   You have an irregular or very fast heartbeat.   You have repeated fainting or have seizure-like jerking during an episode.   You faint when sitting or lying down.   You have confusion.   You have difficulty walking.   You have severe weakness.   You have vision problems.  MAKE SURE YOU:   Understand these instructions.  Will  watch your condition.  Will get help right away if you are not doing well or get worse. Document Released: 03/08/2005 Document Revised: 03/13/2013 Document Reviewed: 08/11/2012 Exit[employer] Patient Information 2015 [employer]. This information is not intended to replace advice given to you by your health care provider. Make sure you discuss any questions you have with your health care provider.  

## 2014-07-08 NOTE — ED Provider Notes (Signed)
CSN: [id number]     Arrival date & time 07/08/14  2118 History   First MD Initiated Contact with Patient 07/08/14 2330     Chief Complaint  Patient presents with  . Near Syncope     (Consider location/radiation/quality/duration/timing/severity/associated sxs/prior Treatment) HPI Comments: Patient presents to the ER for evaluation of near syncope. Patient reports that he suddenly started to feel dizzy and then got hot all over. He felt like he was going to pass out, but did not. He did feel slightly short of breath when this was occurring, but that has resolved. All symptoms are now resolved, lasted approximately 15 minutes. He reports that he did have a similar episode 2 weeks ago, but did not seek medical attention.  Patient denies chest pain, heart palpitations associated with the symptoms. He does report that he is working 2 jobs and is under a lot of stress.  Patient is a 44 y.o. male presenting with near-syncope.  Near Syncope    History reviewed. No pertinent past medical history. History reviewed. No pertinent past surgical history. Family History  Problem Relation Age of Onset  . Diabetes Mother   . Hypertension Mother   . Thyroid disease Mother    History  Substance Use Topics  . Smoking status: Former Games developer  . Smokeless tobacco: Never Used  . Alcohol Use: No    Review of Systems  Cardiovascular: Positive for near-syncope.  Neurological: Positive for dizziness and weakness.  All other systems reviewed and are negative.     Allergies  Review of patient's allergies indicates no known allergies.  Home Medications   Prior to Admission medications   Medication Sig Start Date End Date Taking? Authorizing Provider  ibuprofen (ADVIL,MOTRIN) 200 MG tablet Take 200 mg by mouth every 6 (six) hours as needed for pain.   Yes Historical Provider, MD  naproxen sodium (ANAPROX) 220 MG tablet Take 220 mg by mouth 2 (two) times daily as needed (for pain).    Yes Historical  Provider, MD  chlorpheniramine-HYDROcodone (TUSSIONEX PENNKINETIC ER) 10-8 MG/5ML LQCR Take 5 mLs by mouth every 12 (twelve) hours as needed for cough (and pain). Patient not taking: Reported on 07/08/2014 02/27/14   Trixie Dredge, PA-C  naproxen (NAPROSYN) 500 MG tablet Take 1 tablet (500 mg total) by mouth 2 (two) times daily. Patient not taking: Reported on 07/08/2014 03/28/14   Antony Madura, PA-C  ondansetron (ZOFRAN ODT) 8 MG disintegrating tablet Take 1 tablet (8 mg total) by mouth every 8 (eight) hours as needed for nausea. Patient not taking: Reported on 07/08/2014 09/11/12   Azalia Bilis, MD  oxymetazoline [facility] NASAL SPRAY) 0.05 % nasal spray Place 1 spray into both nostrils 2 (two) times daily. X 3 days only Patient not taking: Reported on 07/08/2014 02/27/14   Trixie Dredge, PA-C   BP 118/72 mmHg  Pulse 84  Temp(Src) 98 F (36.7 C)  Resp 20  SpO2 100% Physical Exam  Constitutional: He is oriented to person, place, and time. He appears well-developed and well-nourished. No distress.  HENT:  Head: Normocephalic and atraumatic.  Right Ear: Hearing normal.  Left Ear: Hearing normal.  Nose: Nose normal.  Mouth/Throat: Oropharynx is clear and moist and mucous membranes are normal.  Eyes: Conjunctivae and EOM are normal. Pupils are equal, round, and reactive to light.  Neck: Normal range of motion. Neck supple.  Cardiovascular: Regular rhythm, S1 normal and S2 normal.  Exam reveals no gallop and no friction rub.   No murmur heard. Pulmonary/Chest:  Effort normal and breath sounds normal. No respiratory distress. He exhibits no tenderness.  Abdominal: Soft. Normal appearance and bowel sounds are normal. There is no hepatosplenomegaly. There is no tenderness. There is no rebound, no guarding, no tenderness at McBurney's point and negative Murphy's sign. No hernia.  Musculoskeletal: Normal range of motion.  Neurological: He is alert and oriented to person, place, and time. He has normal strength. No  cranial nerve deficit or sensory deficit. Coordination normal. GCS eye subscore is 4. GCS verbal subscore is 5. GCS motor subscore is 6.  Skin: Skin is warm, dry and intact. No rash noted. No cyanosis.  Psychiatric: He has a normal mood and affect. His speech is normal and behavior is normal. Thought content normal.  Nursing note and vitals reviewed.   ED Course  Procedures (including critical care time) Labs Review Labs Reviewed  BASIC METABOLIC PANEL - Abnormal; Notable for the following:    Sodium 133 (*)    Glucose, Bld 102 (*)    GFR calc non Af Amer 78 (*)    All other components within normal limits  CBC  CBG MONITORING, ED    Imaging Review No results found.   EKG Interpretation   Date/Time:  Monday July 08 2014 23:43:00 EDT Ventricular Rate:  88 PR Interval:  134 QRS Duration: 83 QT Interval:  401 QTC Calculation: 485 R Axis:   -121 Text Interpretation:  Sinus rhythm Right atrial enlargement Anterior  infarct, old No previous tracing Confirmed by Kylyn Mcdade  MD, Junetta Hearn  [phone]([id number]) on 07/08/2014 11:50:57 PM      MDM   Final diagnoses:  Near syncope    Patient presents to the ER for evaluation of near syncope. Patient's symptoms lasted approximately 15 minutes and then resolved. He did not have any symptoms of chest pain. He is completely back to normal. His workup today has been unremarkable. Patient reports that he is under great stress. He is working 2 jobs currently. This is likely adding to his symptoms. He did have some symptoms that sounded like possibly anxiety earlier. This patient is back to baseline and workup has been negative, it is felt that the patient is safe for discharge. Will be given a work note for tomorrow, increase his fluid intake and rest.    Gilda Creasehristopher J Jadd Gasior, MD 07/08/14 2352

## 2014-07-08 NOTE — ED Notes (Signed)
Pt states he got a hotflash earlier today and felt jittery, thought he was going to pass out. Pt reports same happened 2 weeks ago.

## 2014-07-08 NOTE — ED Notes (Signed)
Pt reports episode of feeling "real dizzy and hot all over" this evening, lasted about 15-20 minutes then resolved on its own. Pt denies any sx at this time, no SOB, no pain.

## 2014-07-11 ENCOUNTER — Encounter (HOSPITAL_COMMUNITY): Payer: Self-pay | Admitting: Emergency Medicine

## 2014-07-11 ENCOUNTER — Emergency Department (HOSPITAL_COMMUNITY)
Admission: EM | Admit: 2014-07-11 | Discharge: 2014-07-11 | Disposition: A | Discharge status: Home / Self Care | Payer: 59 | Attending: Emergency Medicine | Admitting: Emergency Medicine

## 2014-07-11 DIAGNOSIS — R55 Syncope and collapse: Secondary | ICD-10-CM | POA: Diagnosis present

## 2014-07-11 DIAGNOSIS — Z791 Long term (current) use of non-steroidal anti-inflammatories (NSAID): Secondary | ICD-10-CM | POA: Insufficient documentation

## 2014-07-11 DIAGNOSIS — Z87891 Personal history of nicotine dependence: Secondary | ICD-10-CM | POA: Diagnosis not present

## 2014-07-11 DIAGNOSIS — I951 Orthostatic hypotension: Secondary | ICD-10-CM | POA: Diagnosis not present

## 2014-07-11 LAB — DIFFERENTIAL
BASOS PCT: 0 % (ref 0–1)
Basophils Absolute: 0 10*3/uL (ref 0.0–0.1)
Eosinophils Absolute: 0.1 10*3/uL (ref 0.0–0.7)
Eosinophils Relative: 1 % (ref 0–5)
LYMPHS ABS: 0.5 10*3/uL — AB (ref 0.7–4.0)
LYMPHS PCT: 6 % — AB (ref 12–46)
MONO ABS: 0.6 10*3/uL (ref 0.1–1.0)
MONOS PCT: 7 % (ref 3–12)
Neutro Abs: 7.2 10*3/uL (ref 1.7–7.7)
Neutrophils Relative %: 86 % — ABNORMAL HIGH (ref 43–77)

## 2014-07-11 LAB — URINALYSIS, ROUTINE W REFLEX MICROSCOPIC
BILIRUBIN URINE: NEGATIVE
Glucose, UA: NEGATIVE mg/dL
HGB URINE DIPSTICK: NEGATIVE
Ketones, ur: NEGATIVE mg/dL
Leukocytes, UA: NEGATIVE
NITRITE: NEGATIVE
PROTEIN: 30 mg/dL — AB
Specific Gravity, Urine: 1.026 (ref 1.005–1.030)
UROBILINOGEN UA: 1 mg/dL (ref 0.0–1.0)
pH: 5.5 (ref 5.0–8.0)

## 2014-07-11 LAB — CBC
HEMATOCRIT: 41.7 % (ref 39.0–52.0)
Hemoglobin: 14.4 g/dL (ref 13.0–17.0)
MCH: 31.8 pg (ref 26.0–34.0)
MCHC: 34.5 g/dL (ref 30.0–36.0)
MCV: 92.1 fL (ref 78.0–100.0)
PLATELETS: 171 10*3/uL (ref 150–400)
RBC: 4.53 MIL/uL (ref 4.22–5.81)
RDW: 12.6 % (ref 11.5–15.5)
WBC: 8.9 10*3/uL (ref 4.0–10.5)

## 2014-07-11 LAB — URINE MICROSCOPIC-ADD ON

## 2014-07-11 LAB — BASIC METABOLIC PANEL
Anion gap: 7 (ref 5–15)
BUN: 26 mg/dL — ABNORMAL HIGH (ref 6–23)
CALCIUM: 9.2 mg/dL (ref 8.4–10.5)
CO2: 27 mmol/L (ref 19–32)
CREATININE: 1.11 mg/dL (ref 0.50–1.35)
Chloride: 105 mmol/L (ref 96–112)
GFR calc Af Amer: >90 mL/min (ref >90)
GFR calc non Af Amer: 79 mL/min — ABNORMAL LOW (ref >90)
GLUCOSE: 115 mg/dL — AB (ref 70–99)
POTASSIUM: 4.2 mmol/L (ref 3.5–5.1)
SODIUM: 139 mmol/L (ref 135–145)

## 2014-07-11 LAB — HIV ANTIBODY (ROUTINE TESTING W REFLEX): HIV SCREEN 4TH GENERATION: NONREACTIVE

## 2014-07-11 MED ORDER — SODIUM CHLORIDE 0.9 % IV BOLUS (SEPSIS)
1000.0000 mL | Freq: Once | INTRAVENOUS | Status: AC
  Administered 2014-07-11: 1000 mL via INTRAVENOUS

## 2014-07-11 MED ORDER — SODIUM CHLORIDE 0.9 % IV BOLUS (SEPSIS)
1000.0000 mL | Freq: Once | INTRAVENOUS | Status: AC
Start: 2014-07-11 — End: 2014-07-11
  Administered 2014-07-11: 1000 mL via INTRAVENOUS

## 2014-07-11 NOTE — ED Notes (Signed)
Pt escorted to discharge window. Verbalized understanding discharge instructions. In no acute distress.   

## 2014-07-11 NOTE — ED Notes (Signed)
Pt was unable to do the standing orthostatic vitals.  Pt stated that he "felt lightheaded and dizzy" upon standing.

## 2014-07-11 NOTE — ED Provider Notes (Signed)
CSN: [id number]     Arrival date & time 07/11/14  0027 History   First MD Initiated Contact with Patient 07/11/14 [phone]     Chief Complaint  Patient presents with  . Syncope      (Consider location/radiation/quality/duration/timing/severity/associated sxs/prior Treatment) HPI  This is a 44 year old male with about a 10 year history of episodic diarrhea. He got up this morning just prior to arrival feeling the urge to move his bowels. He barely made it to the toilet in time and had explosive diarrhea. He felt lightheaded and subsequently passed out. He awoke on the floor sometime later. It is not clear how long he was out. His blood pressure was noted to be soft by EMS and they gave him 300 milliliters of normal saline IV and 4 milligrams of IV Zofran for nausea with partial improvement. He was lightheaded on standing in the ED. 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 He was seen on April 18 for near syncopal episode. He denies chest pain or shortness of breath.  History reviewed. No pertinent past medical history. History reviewed. No pertinent past surgical history. Family History  Problem Relation Age of Onset  . Diabetes Mother   . Hypertension Mother   . Thyroid disease Mother    History  Substance Use Topics  . Smoking status: Former Games developer  . Smokeless tobacco: Never Used  . Alcohol Use: No    Review of Systems  All other systems reviewed and are negative.   Allergies  Review of patient's allergies indicates no known allergies.  Home Medications   Prior to Admission medications   Medication Sig Start Date End Date Taking? Authorizing Provider  ibuprofen (ADVIL,MOTRIN) 200 MG tablet Take 200 mg by mouth every 6 (six) hours as needed for pain.   Yes Historical Provider, MD  naproxen sodium (ANAPROX) 220 MG tablet Take 220 mg by mouth 2 (two) times daily as needed (for pain).    Yes Historical Provider, MD   chlorpheniramine-HYDROcodone (TUSSIONEX PENNKINETIC ER) 10-8 MG/5ML LQCR Take 5 mLs by mouth every 12 (twelve) hours as needed for cough (and pain). Patient not taking: Reported on 07/08/2014 02/27/14   Trixie Dredge, PA-C  naproxen (NAPROSYN) 500 MG tablet Take 1 tablet (500 mg total) by mouth 2 (two) times daily. Patient not taking: Reported on 07/08/2014 03/28/14   Antony Madura, PA-C  ondansetron (ZOFRAN ODT) 8 MG disintegrating tablet Take 1 tablet (8 mg total) by mouth every 8 (eight) hours as needed for nausea. Patient not taking: Reported on 07/08/2014 09/11/12   Azalia Bilis, MD  oxymetazoline [facility] NASAL SPRAY) 0.05 % nasal spray Place 1 spray into both nostrils 2 (two) times daily. X 3 days only Patient not taking: Reported on 07/08/2014 02/27/14   Trixie Dredge, PA-C   BP 125/69 mmHg  Pulse 71  Temp(Src) 97.6 F (36.4 C) (Oral)  Resp 15  SpO2 100%   Physical Exam  General: Well-developed, well-nourished male in no acute distress; appearance consistent with age of record HENT: normocephalic; atraumatic Eyes: pupils equal, round and reactive to light; extraocular muscles intact Neck: supple Heart: regular rate and rhythm with sinus arrhythmia Lungs: clear to auscultation bilaterally Abdomen: soft; nondistended; nontender; no masses or hepatosplenomegaly; bowel sounds present Extremities: No deformity; full range of motion; pulses normal Neurologic: Awake, alert and oriented; motor function intact in all extremities and symmetric; no facial droop Skin: Warm and dry  Psychiatric: Normal mood and affect    ED Course  Procedures (including critical care time)   MDM   Nursing notes and vitals signs, including pulse oximetry, reviewed.  Summary of this visit's results, reviewed by myself:   EKG Interpretation  Date/Time:  Thursday July 11 2014 01:36:54 EDT Ventricular Rate:  69 PR Interval:  136 QRS Duration: 82 QT Interval:  432 QTC Calculation: 463 R Axis:   -107 Text  Interpretation:  Sinus rhythm Left anterior fascicular block Consider left ventricular hypertrophy Anterior Q waves, possibly due to LVH Rate is slower Confirmed by Esmay Amspacher  MD, Jonny Ruiz ([id number]) on 07/11/2014 1:53:37 AM       Labs:  Results for orders placed or performed during the hospital encounter of 07/11/14 (from the past 24 hour(s))  CBC  (at [facility])     Status: None   Collection Time: 07/11/14  1:35 AM  Result Value Ref Range   WBC 8.9 4.0 - 10.5 K/uL   RBC 4.53 4.22 - 5.81 MIL/uL   Hemoglobin 14.4 13.0 - 17.0 g/dL   HCT 62.1 30.8 - 65.7 %   MCV 92.1 78.0 - 100.0 fL   MCH 31.8 26.0 - 34.0 pg   MCHC 34.5 30.0 - 36.0 g/dL   RDW 84.6 96.2 - 95.2 %   Platelets 171 150 - 400 K/uL  Basic metabolic panel  (at [facility])     Status: Abnormal   Collection Time: 07/11/14  1:35 AM  Result Value Ref Range   Sodium 139 135 - 145 mmol/L   Potassium 4.2 3.5 - 5.1 mmol/L   Chloride 105 96 - 112 mmol/L   CO2 27 19 - 32 mmol/L   Glucose, Bld 115 (H) 70 - 99 mg/dL   BUN 26 (H) 6 - 23 mg/dL   Creatinine, Ser 8.41 0.50 - 1.35 mg/dL   Calcium 9.2 8.4 - 32.4 mg/dL   GFR calc non Af Amer 79 (L) >90 mL/min   GFR calc Af Amer >90 >90 mL/min   Anion gap 7 5 - 15  Differential     Status: Abnormal   Collection Time: 07/11/14  1:35 AM  Result Value Ref Range   Neutrophils Relative % 86 (H) 43 - 77 %   Neutro Abs 7.2 1.7 - 7.7 K/uL   Lymphocytes Relative 6 (L) 12 - 46 %   Lymphs Abs 0.5 (L) 0.7 - 4.0 K/uL   Monocytes Relative 7 3 - 12 %   Monocytes Absolute 0.6 0.1 - 1.0 K/uL   Eosinophils Relative 1 0 - 5 %   Eosinophils Absolute 0.1 0.0 - 0.7 K/uL   Basophils Relative 0 0 - 1 %   Basophils Absolute 0.0 0.0 - 0.1 K/uL  Urinalysis, Routine w reflex microscopic     Status: Abnormal   Collection Time: 07/11/14  3:15 AM  Result Value Ref Range   Color, Urine AMBER (A) YELLOW   APPearance CLEAR CLEAR   Specific Gravity, Urine 1.026 1.005 - 1.030   pH 5.5 5.0 - 8.0    Glucose, UA NEGATIVE NEGATIVE mg/dL   Hgb urine dipstick NEGATIVE NEGATIVE   Bilirubin Urine NEGATIVE NEGATIVE   Ketones, ur NEGATIVE NEGATIVE mg/dL   Protein, ur 30 (A) NEGATIVE mg/dL   Urobilinogen, UA 1.0 0.0 - 1.0 mg/dL   Nitrite NEGATIVE NEGATIVE   Leukocytes, UA NEGATIVE NEGATIVE  Urine microscopic-add on     Status: Abnormal   Collection Time: 07/11/14  3:15  AM  Result Value Ref Range   Squamous Epithelial / LPF RARE RARE   WBC, UA 0-2 <3 WBC/hpf   Bacteria, UA FEW (A) RARE   Urine-Other MUCOUS PRESENT    5:39 AM Patient feels better after 2 liters of normal saline IV but still "feels woozy" when he stands. We'll give an additional 1 liter. 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 The patient was advised of this and was advised that the results will not be obtained while he is in the ED. He does not believe himself to be at high risk for HIV infection.  7:33 AM Patient feeling better. Has eaten without difficulty. He was advised to keep hydrated. He was advised that he'll be contacted if his HIV test is positive.    Paula LibraJohn Lilliemae Fruge, MD 07/11/14 [phone]

## 2014-07-11 NOTE — ED Notes (Signed)
Informed the pt a urine specimen is needed. 

## 2014-07-11 NOTE — ED Notes (Signed)
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

## 2015-03-14 ENCOUNTER — Encounter: Payer: Self-pay | Admitting: *Deleted

## 2015-03-14 ENCOUNTER — Emergency Department
Admission: EM | Admit: 2015-03-14 | Discharge: 2015-03-14 | Disposition: A | Discharge status: Home / Self Care | Payer: 59 | Attending: Emergency Medicine | Admitting: Emergency Medicine

## 2015-03-14 DIAGNOSIS — E876 Hypokalemia: Secondary | ICD-10-CM | POA: Insufficient documentation

## 2015-03-14 DIAGNOSIS — R61 Generalized hyperhidrosis: Secondary | ICD-10-CM | POA: Insufficient documentation

## 2015-03-14 DIAGNOSIS — R55 Syncope and collapse: Secondary | ICD-10-CM

## 2015-03-14 DIAGNOSIS — Z87891 Personal history of nicotine dependence: Secondary | ICD-10-CM | POA: Diagnosis not present

## 2015-03-14 DIAGNOSIS — R112 Nausea with vomiting, unspecified: Secondary | ICD-10-CM | POA: Insufficient documentation

## 2015-03-14 LAB — COMPREHENSIVE METABOLIC PANEL
ALK PHOS: 57 U/L (ref 38–126)
ALT: 24 U/L (ref 17–63)
AST: 25 U/L (ref 15–41)
Albumin: 5 g/dL (ref 3.5–5.0)
Anion gap: 14 (ref 5–15)
BILIRUBIN TOTAL: 0.9 mg/dL (ref 0.3–1.2)
BUN: 23 mg/dL — AB (ref 6–20)
CALCIUM: 9.2 mg/dL (ref 8.9–10.3)
CO2: 20 mmol/L — ABNORMAL LOW (ref 22–32)
CREATININE: 1 mg/dL (ref 0.61–1.24)
Chloride: 103 mmol/L (ref 101–111)
GFR calc Af Amer: >60 mL/min (ref >60)
GFR calc non Af Amer: >60 mL/min (ref >60)
Glucose, Bld: 165 mg/dL — ABNORMAL HIGH (ref 65–99)
Potassium: 2.9 mmol/L — CL (ref 3.5–5.1)
Sodium: 137 mmol/L (ref 135–145)
Total Protein: 7.8 g/dL (ref 6.5–8.1)

## 2015-03-14 LAB — CBC
HCT: 41.8 % (ref 40.0–52.0)
Hemoglobin: 14.1 g/dL (ref 13.0–18.0)
MCH: 30.6 pg (ref 26.0–34.0)
MCHC: 33.7 g/dL (ref 32.0–36.0)
MCV: 90.7 fL (ref 80.0–100.0)
PLATELETS: 214 10*3/uL (ref 150–440)
RBC: 4.61 MIL/uL (ref 4.40–5.90)
RDW: 13.4 % (ref 11.5–14.5)
WBC: 9.1 10*3/uL (ref 3.8–10.6)

## 2015-03-14 LAB — URINALYSIS COMPLETE WITH MICROSCOPIC (ARMC ONLY)
BILIRUBIN URINE: NEGATIVE
Bacteria, UA: NONE SEEN
GLUCOSE, UA: NEGATIVE mg/dL
HGB URINE DIPSTICK: NEGATIVE
Ketones, ur: NEGATIVE mg/dL
LEUKOCYTES UA: NEGATIVE
NITRITE: NEGATIVE
Protein, ur: NEGATIVE mg/dL
RBC / HPF: NONE SEEN RBC/hpf (ref 0–5)
Specific Gravity, Urine: 1.018 (ref 1.005–1.030)
Squamous Epithelial / LPF: NONE SEEN
pH: 7 (ref 5.0–8.0)

## 2015-03-14 LAB — TROPONIN I: Troponin I: <0.03 ng/mL (ref <0.031)

## 2015-03-14 LAB — LIPASE, BLOOD: Lipase: 16 U/L (ref 11–51)

## 2015-03-14 MED ORDER — POTASSIUM CHLORIDE CRYS ER 20 MEQ PO TBCR
20.0000 meq | EXTENDED_RELEASE_TABLET | Freq: Once | ORAL | Status: AC
  Administered 2015-03-14: 20 meq via ORAL
  Filled 2015-03-14: qty 1

## 2015-03-14 MED ORDER — ONDANSETRON HCL 4 MG/2ML IJ SOLN
4.0000 mg | Freq: Once | INTRAMUSCULAR | Status: AC | PRN
  Administered 2015-03-14: 4 mg via INTRAVENOUS

## 2015-03-14 MED ORDER — ONDANSETRON HCL 4 MG/2ML IJ SOLN
INTRAMUSCULAR | Status: AC
  Administered 2015-03-14: 4 mg via INTRAVENOUS
  Filled 2015-03-14: qty 2

## 2015-03-14 MED ORDER — SODIUM CHLORIDE 0.9 % IV BOLUS (SEPSIS)
1000.0000 mL | Freq: Once | INTRAVENOUS | Status: AC
  Administered 2015-03-14: 1000 mL via INTRAVENOUS

## 2015-03-14 NOTE — ED Notes (Signed)
Dr. Carollee MassedKaminski notified of abnormal k+

## 2015-03-14 NOTE — Discharge Instructions (Signed)
Vasovagal episode  Is unclear why you are having these episodes where you almost passed out and become nauseous. It is likely a vasovagal episode. Follow[facility] or in [place] as we discussed. Return to the emergency department if you have further episodes, palpitations, increased weakness, or other urgent concerns. Your potassium level was 2.9 today. This is a little bit low. Try to eat more fruits and potassium rich foods.  Syncope, commonly known as fainting, is a temporary loss of consciousness. It occurs when the blood flow to the brain is reduced. Vasovagal syncope (also called neurocardiogenic syncope) is a fainting spell in which the blood flow to the brain is reduced because of a sudden drop in heart rate and blood pressure. Vasovagal syncope occurs when the brain and the cardiovascular system (blood vessels) do not adequately communicate and respond to each other. This is the most common cause of fainting. It often occurs in response to fear or some other type of emotional or physical stress. The body has a reaction in which the heart starts beating too slowly or the blood vessels expand, reducing blood pressure. This type of fainting spell is generally considered harmless. However, injuries can occur if a person takes a sudden fall during a fainting spell.  CAUSES  Vasovagal syncope occurs when a person's blood pressure and heart rate decrease suddenly, usually in response to a trigger. Many things and situations can trigger an episode. Some of these include:   Pain.   Fear.   The sight of blood or medical procedures, such as blood being drawn from a vein.   Common activities, such as coughing, swallowing, stretching, or going to the bathroom.   Emotional stress.   Prolonged standing, especially in a warm environment.   Lack of sleep or rest.   Prolonged lack of food.   Prolonged lack of fluids.   Recent illness.  The use  of certain drugs that affect blood pressure, such as cocaine, alcohol, marijuana, inhalants, and opiates.  SYMPTOMS  Before the fainting episode, you may:   Feel dizzy or light headed.   Become pale.  Sense that you are going to faint.   Feel like the room is spinning.   Have tunnel vision, only seeing directly in front of you.   Feel sick to your stomach (nauseous).   See spots or slowly lose vision.   Hear ringing in your ears.   Have a headache.   Feel warm and sweaty.   Feel a sensation of pins and needles. During the fainting spell, you will generally be unconscious for no longer than a couple minutes before waking up and returning to normal. If you get up too quickly before your body can recover, you may faint again. Some twitching or jerky movements may occur during the fainting spell.  DIAGNOSIS  Your health care provider will ask about your symptoms, take a medical history, and perform a physical exam. Various tests may be done to rule out other causes of fainting. These may include blood tests and tests to check the heart, such as electrocardiography, echocardiography, and possibly an electrophysiology study. When other causes have been ruled out, a test may be done to check the body's response to changes in position (tilt table test). TREATMENT  Most cases of vasovagal syncope do not require treatment. Your health care provider may recommend ways to avoid fainting triggers and may provide home strategies for preventing fainting. If you must be exposed to  a possible trigger, you can drink additional fluids to help reduce your chances of having an episode of vasovagal syncope. If you have warning signs of an oncoming episode, you can respond by positioning yourself favorably (lying down). If your fainting spells continue, you may be given medicines to prevent fainting. Some medicines may help make you more resistant to repeated episodes of vasovagal syncope. Special  exercises or compression stockings may be recommended. In rare cases, the surgical placement of a pacemaker is considered. HOME CARE INSTRUCTIONS   Learn to identify the warning signs of vasovagal syncope.   Sit or lie down at the first warning sign of a fainting spell. If sitting, put your head down between your legs. If you lie down, swing your legs up in the air to increase blood flow to the brain.   Avoid hot tubs and saunas.  Avoid prolonged standing.  Drink enough fluids to keep your urine clear or pale yellow. Avoid caffeine.  Increase salt in your diet as directed by your health care provider.   If you have to stand for a long time, perform movements such as:   Crossing your legs.   Flexing and stretching your leg muscles.   Squatting.   Moving your legs.   Bending over.   Only take over-the-counter or prescription medicines as directed by your health care provider. Do not suddenly stop any medicines without asking your health care provider first. SEEK MEDICAL CARE IF:   Your fainting spells continue or happen more frequently in spite of treatment.   You lose consciousness for more than a couple minutes.  You have fainting spells during or after exercising or after being startled.   You have new symptoms that occur with the fainting spells, such as:   Shortness of breath.  Chest pain.   Irregular heartbeat.   You have episodes of twitching or jerky movements that last longer than a few seconds.  You have episodes of twitching or jerky movements without obvious fainting. SEEK IMMEDIATE MEDICAL CARE IF:   You have injuries or bleeding after a fainting spell.   You have episodes of twitching or jerky movements that last longer than 5 minutes.   You have more than one spell of twitching or jerky movements before returning to consciousness after fainting.   This information is not intended to replace advice given to you by your health care  provider. Make sure you discuss any questions you have with your health care provider.   Document Released: 02/23/2012 Document Revised: 07/23/2014 Document Reviewed: 02/23/2012 Elsevier Interactive Patient Education [employer]

## 2015-03-14 NOTE — ED Notes (Signed)
Vomiting today

## 2015-03-14 NOTE — ED Provider Notes (Signed)
[facility] Emergency Department Provider Note  ____________________________________________  Time seen: 1047  I have reviewed the triage vital signs and the nursing notes.  History by:  Patient is well front  HISTORY  Chief Complaint Near syncope with nausea and vomiting    HPI Guy Foster is a 44 y.o. male who is driving in his car with his girlfriend. He reports he began to feel weak, as if he might pass out. He instructed his girlfriend to take the wheel. He then became increasingly nauseous, rolled on the window, and threw up. He and his girlfriend reports that he has had similar symptoms before. He has episodes where he has a bowel movement and then becomes weak and needs to lie down. At that time he can become a little diaphoretic. His been seen in the hospital after some of these events and number of times. The girlfriend tells me that he has been evaluated for areas illnesses and they do not know why this happens. He does not have diabetes or HIV, according to her. With further discussion, it does sound like the patient has vasovagal episodes.He denies headache or chest pain or shortness of breath. He does not have any focal abdominal pain. Currently, he is feeling better.    History reviewed. No pertinent past medical history.  There are no active problems to display for this patient.   History reviewed. No pertinent past surgical history.  Current Outpatient Rx  Name  Route  Sig  Dispense  Refill  . ibuprofen (ADVIL,MOTRIN) 200 MG tablet   Oral   Take 200 mg by mouth every 6 (six) hours as needed for pain.         . naproxen sodium (ANAPROX) 220 MG tablet   Oral   Take 220 mg by mouth 2 (two) times daily as needed (for pain).            Allergies Review of patient's allergies indicates no known allergies.  Family History  Problem Relation Age of Onset  . Diabetes Mother   . Hypertension Mother   . Thyroid disease Mother      Social History Social History  Substance Use Topics  . Smoking status: Former Games developermoker  . Smokeless tobacco: Never Used  . Alcohol Use: No    Review of Systems  Constitutional: Negative for fever/chills. Girlfriend's concerned about the patient's weight. He reports his max weight was 150. He now weighs approximately 140 pounds. He has been lighter than this in the past. ENT: Negative for congestion. Cardiovascular: Negative for chest pain. Near-syncope this a.m. See history of present illness Respiratory: Negative for cough. Gastrointestinal: Negative for abdominal pain. Positive for nausea and vomiting with near syncopal episode. Genitourinary: Negative for dysuria. Musculoskeletal: No back pain. Skin: Negative for rash. Neurological: Negative for headache or focal weakness   10-point ROS otherwise negative.  ____________________________________________   PHYSICAL EXAM:  VITAL SIGNS: ED Triage Vitals  Enc Vitals Group     BP 03/14/15 1003 120/64 mmHg     Pulse Rate 03/14/15 1003 72     Resp 03/14/15 1003 20     Temp 03/14/15 1003 96 F (35.6 C)     Temp Source 03/14/15 1003 Oral     SpO2 03/14/15 1003 100 %     Weight 03/14/15 1003 140 lb (63.504 kg)     Height 03/14/15 1003 5\' 10"  (1.778 m)     Head Cir --      Peak Flow --  Pain Score --      Pain Loc --      Pain Edu? --      Excl. in GC? --     Constitutional:  Alert and oriented. Well appearing and in no distress. ENT   Head: Normocephalic and atraumatic.   Nose: No congestion/rhinnorhea.       Mouth: No erythema, no swelling   Cardiovascular: Normal rate, regular rhythm, no murmur noted Respiratory:  Normal respiratory effort, no tachypnea.    Breath sounds are clear and equal bilaterally.  Gastrointestinal: Soft, no distention. Nontender Back: No muscle spasm, no tenderness, no CVA tenderness. Musculoskeletal: No deformity noted. Nontender with normal range of motion in all extremities.   No noted edema. Neurologic:  Communicative. Normal appearing spontaneous movement in all 4 extremities. No gross focal neurologic deficits are appreciated.  Skin:  Skin is warm, dry. No rash noted. Psychiatric: Mood and affect are normal. Speech and behavior are normal.  ____________________________________________    LABS (pertinent positives/negatives)  Labs Reviewed  COMPREHENSIVE METABOLIC PANEL - Abnormal; Notable for the following:    Potassium 2.9 (*)    CO2 20 (*)    Glucose, Bld 165 (*)    BUN 23 (*)    All other components within normal limits  URINALYSIS COMPLETEWITH MICROSCOPIC ([facility] ONLY) - Abnormal; Notable for the following:    Color, Urine YELLOW (*)    APPearance CLEAR (*)    All other components within normal limits  LIPASE, BLOOD  CBC  TROPONIN I     ____________________________________________   EKG  ED ECG REPORT I, Delshawn Stech W, the attending physician, personally viewed and interpreted this ECG.   Date: 03/14/2015  EKG Time: 1005  Rate: 71  Rhythm:  Normal sinus rhythm  Axis: Computer interpretation of right axis at 242  Intervals: QTC of 504  ST&T Change: None noted   ____________________________________________    RADIOLOGY    ____________________________________________  ____________________________________________   INITIAL IMPRESSION / ASSESSMENT AND PLAN / ED COURSE  Pertinent labs & imaging results that were available during my care of the patient were reviewed by me and considered in my medical decision making (see chart for details).  Patient with near syncope and nausea vomiting. He is feeling better now. He does well clinically. We will treat him with 1 L of normal saline and  reexamined him.  Patient with a potassium at 2.9. We will give him 20 mEq by mouth.  ----------------------------------------- 12:03 PM on 03/14/2015 -----------------------------------------  Reexam, the patient appears more comfortable.  We've discussed his results and need for follow-up. Patient appears stable and ready for discharge.  ____________________________________________   FINAL CLINICAL IMPRESSION(S) / ED DIAGNOSES  Final diagnoses:  Vasovagal attack   hypokalemia Nausea vomiting    Darien Ramus, MD 03/14/15 1204

## 2015-04-07 ENCOUNTER — Ambulatory Visit: Payer: 59 | Admitting: Family Medicine

## 2016-03-11 ENCOUNTER — Encounter (HOSPITAL_COMMUNITY): Payer: Self-pay | Admitting: Emergency Medicine

## 2016-03-11 ENCOUNTER — Emergency Department (HOSPITAL_COMMUNITY): Payer: 59

## 2016-03-11 ENCOUNTER — Emergency Department (HOSPITAL_COMMUNITY)
Admission: EM | Admit: 2016-03-11 | Discharge: 2016-03-11 | Disposition: A | Discharge status: Home / Self Care | Payer: 59 | Attending: Emergency Medicine | Admitting: Emergency Medicine

## 2016-03-11 DIAGNOSIS — S6992XA Unspecified injury of left wrist, hand and finger(s), initial encounter: Secondary | ICD-10-CM

## 2016-03-11 DIAGNOSIS — W278XXA Contact with other nonpowered hand tool, initial encounter: Secondary | ICD-10-CM | POA: Diagnosis not present

## 2016-03-11 DIAGNOSIS — S6710XA Crushing injury of unspecified finger(s), initial encounter: Secondary | ICD-10-CM

## 2016-03-11 DIAGNOSIS — S62661A Nondisplaced fracture of distal phalanx of left index finger, initial encounter for closed fracture: Secondary | ICD-10-CM | POA: Insufficient documentation

## 2016-03-11 DIAGNOSIS — Z87891 Personal history of nicotine dependence: Secondary | ICD-10-CM | POA: Diagnosis not present

## 2016-03-11 DIAGNOSIS — Y929 Unspecified place or not applicable: Secondary | ICD-10-CM | POA: Insufficient documentation

## 2016-03-11 DIAGNOSIS — Y939 Activity, unspecified: Secondary | ICD-10-CM | POA: Diagnosis not present

## 2016-03-11 DIAGNOSIS — S62639A Displaced fracture of distal phalanx of unspecified finger, initial encounter for closed fracture: Secondary | ICD-10-CM

## 2016-03-11 DIAGNOSIS — Y999 Unspecified external cause status: Secondary | ICD-10-CM | POA: Diagnosis not present

## 2016-03-11 MED ORDER — ACETAMINOPHEN 500 MG PO TABS
1000.0000 mg | ORAL_TABLET | Freq: Once | ORAL | Status: AC
  Administered 2016-03-11: 1000 mg via ORAL
  Filled 2016-03-11: qty 2

## 2016-03-11 NOTE — Discharge Instructions (Signed)
Your x-ray today showed a tuft fracture of your left index finger.    You need to wear the splint for a minimum of 3-4 weeks.  Take tylenol or ibuprofen for pain.  Ice and elevation will help with the pain, inflammation and swelling.    Please follow up with a hand surgeon in 1-2 weeks to ensure appropriate healing.   Please read attached information on "nail bed injury", "subungual hematoma" and "finger fracture".  Return to emergency department if you develop nail changes.

## 2016-03-11 NOTE — ED Triage Notes (Signed)
Patient here from home with complaints of left pointer finger injury. States that he hit finger with hammer last night. Bruising noted. No other complaints.

## 2016-03-15 NOTE — ED Provider Notes (Signed)
[facility]-EMERGENCY DEPT Provider Note   CSN: [id number] Arrival date & time: 03/11/16  1840     History   Chief Complaint Chief Complaint  Patient presents with  . Finger Injury    HPI Guy Foster is a 45 y.o. male with no pmh who presents with L pointer finger pain associated with bruising s/p hammer accident last night.  Pt was wearing a glove.  Pt reports some numbness at the very tip of the pointer finger.  No other complaints.   HPI  History reviewed. No pertinent past medical history.  There are no active problems to display for this patient.   History reviewed. No pertinent surgical history.     Home Medications    Prior to Admission medications   Medication Sig Start Date End Date Taking? Authorizing Provider  ibuprofen (ADVIL,MOTRIN) 200 MG tablet Take 200 mg by mouth every 6 (six) hours as needed for pain.    Historical Provider, MD  naproxen sodium (ANAPROX) 220 MG tablet Take 220 mg by mouth 2 (two) times daily as needed (for pain).     Historical Provider, MD    Family History Family History  Problem Relation Age of Onset  . Diabetes Mother   . Hypertension Mother   . Thyroid disease Mother     Social History Social History  Substance Use Topics  . Smoking status: Former Games developermoker  . Smokeless tobacco: Never Used  . Alcohol use No     Allergies   Patient has no known allergies.   Review of Systems Review of Systems  Constitutional: Negative for fever.  HENT: Negative for congestion.   Eyes: Negative for visual disturbance.  Respiratory: Negative for cough and shortness of breath.   Gastrointestinal: Negative for nausea.  Genitourinary: Negative for difficulty urinating.  Musculoskeletal: Positive for arthralgias.  Skin: Positive for color change and wound.  Allergic/Immunologic: Negative for immunocompromised state.  Neurological: Negative for headaches.  Psychiatric/Behavioral: Negative for agitation.     Physical Exam Updated  Vital Signs BP 119/90 (BP Location: Left Arm)   Pulse 77   Temp 98.7 F (37.1 C) (Oral)   Resp 16   Wt 65.8 kg   SpO2 97%   BMI 20.81 kg/m   Physical Exam  Constitutional: He is oriented to person, place, and time. Vital signs are normal. He appears well-developed and well-nourished. No distress.  HENT:  Head: Normocephalic and atraumatic.  Eyes: EOM are normal. Pupils are equal, round, and reactive to light.  Neck: Normal range of motion.  Cardiovascular: Normal rate and regular rhythm.   Pulmonary/Chest: Effort normal and breath sounds normal.  Musculoskeletal: Normal range of motion.  L second digit with ecchymosis at anterior and posterior aspect.  Tenderness at L second digit phalanx.  No subungal hematoma.  No skin lacerations or bleeding. Normal flexion and extension of L second digit with some tenderness.   Neurological: He is alert and oriented to person, place, and time.  Normal grip strength bilaterally.  Intact sensation to light to in radial, medial and ulnar nerve distribution of upper extremities.   Skin: Skin is warm and dry.  Psychiatric: He has a normal mood and affect. His behavior is normal.  Nursing note and vitals reviewed.    ED Treatments / Results  Labs (all labs ordered are listed, but only abnormal results are displayed) Labs Reviewed - No data to display  EKG  EKG Interpretation None       Radiology No results found.  Procedures  Procedures (including critical care time)  Medications Ordered in ED Medications  acetaminophen (TYLENOL) tablet 1,000 mg (1,000 mg Oral Given 03/11/16 2149)     Initial Impression / Assessment and Plan / ED Course  I have reviewed the triage vital signs and the nursing notes.  Pertinent labs & imaging results that were available during my care of the patient were reviewed by me and considered in my medical decision making (see chart for details).  Clinical Course    X-ray remarkable for nondisplaced tuft  fracture of the second distal phalanx.  Pt given tylenol for pain since he is driving himself home.  Pt's second digit placed in splint.  Pt discharged with analgesics, splint, RICE, and hand surgeon f/u in 1-2 weeks.  Pt given contact information for hand surgeon.  Pt verbalized understanding and agreeable to dispo plan.   Final Clinical Impressions(s) / ED Diagnoses   Final diagnoses:  Closed fracture of tuft of distal phalanx of finger  Injury of finger of left hand, initial encounter  Crushing injury of finger, initial encounter    New Prescriptions Discharge Medication List as of 03/11/2016  9:43 PM       [place]ty Handylaudia J Barrett Holthaus, PA-C 03/15/16 1917    Canary Brimhristopher J Tegeler, MD 03/15/16 1918

## 2016-04-01 ENCOUNTER — Encounter (HOSPITAL_COMMUNITY): Payer: Self-pay | Admitting: Emergency Medicine

## 2016-04-01 ENCOUNTER — Emergency Department (HOSPITAL_COMMUNITY)
Admission: EM | Admit: 2016-04-01 | Discharge: 2016-04-01 | Disposition: A | Discharge status: Home / Self Care | Payer: 59 | Attending: Emergency Medicine | Admitting: Emergency Medicine

## 2016-04-01 DIAGNOSIS — B349 Viral infection, unspecified: Secondary | ICD-10-CM

## 2016-04-01 DIAGNOSIS — Z87891 Personal history of nicotine dependence: Secondary | ICD-10-CM | POA: Diagnosis not present

## 2016-04-01 DIAGNOSIS — R05 Cough: Secondary | ICD-10-CM | POA: Diagnosis present

## 2016-04-01 NOTE — ED Triage Notes (Signed)
Pt comes today with complaints of congestion, sore throat, and dry cough for the past 2 days.  Denies fever or any other symptoms.  Afebrile on exam. Ambulatory in triage.

## 2016-04-01 NOTE — ED Notes (Signed)
Pt ambulatory and independent at discharge.  Verbalized understanding of discharge instructions 

## 2016-04-01 NOTE — ED Provider Notes (Signed)
[facility] DEPT Provider Note   CSN: [id number] Arrival date & time: 04/01/16  1422  By signing my name below, I, Vista Minkobert Ross, attest that this documentation has been prepared under the direction and in the presence of Langston MaskerKaren Sofia PA-C.  Electronically Signed: Vista Minkobert Ross, ED Scribe. 04/01/16. 3:50 PM.  History   Chief Complaint Chief Complaint  Patient presents with  . Flu Like Symptoms    HPI HPI Comments: Guy Foster is a 46 y.o. male who presents to the Emergency Department complaining of worsening congestion, sore throat and dry cough that started two days ago. Pt has tried ibuprofen without significant relief of his symptoms. He denies any known sick contacts. Pt also requesting work note. No fever.  The history is provided by the patient. No language interpreter was used.    History reviewed. No pertinent past medical history.  There are no active problems to display for this patient.   History reviewed. No pertinent surgical history.     Home Medications    Prior to Admission medications   Medication Sig Start Date End Date Taking? Authorizing Provider  ibuprofen (ADVIL,MOTRIN) 200 MG tablet Take 200 mg by mouth every 6 (six) hours as needed for pain.    Historical Provider, MD  naproxen sodium (ANAPROX) 220 MG tablet Take 220 mg by mouth 2 (two) times daily as needed (for pain).     Historical Provider, MD    Family History Family History  Problem Relation Age of Onset  . Diabetes Mother   . Hypertension Mother   . Thyroid disease Mother     Social History Social History  Substance Use Topics  . Smoking status: Former Games developermoker  . Smokeless tobacco: Never Used  . Alcohol use No     Allergies   Patient has no known allergies.   Review of Systems Review of Systems  Constitutional: Negative for fever.  HENT: Positive for congestion and sore throat.   Respiratory: Positive for cough.      Physical Exam Updated Vital Signs BP 115/90 (BP  Location: Left Arm)   Pulse 83   Temp 98.1 F (36.7 C) (Oral)   Resp 16   Ht 5\' 10"  (1.778 m)   Wt 145 lb (65.8 kg)   SpO2 96%   BMI 20.81 kg/m   Physical Exam  Constitutional: He is oriented to person, place, and time. He appears well-developed and well-nourished. No distress.  HENT:  Head: Normocephalic and atraumatic.  Neck: Normal range of motion.  Cardiovascular: Normal rate and regular rhythm.   Pulmonary/Chest: Effort normal and breath sounds normal. He has no wheezes. He has no rales.  Neurological: He is alert and oriented to person, place, and time.  Skin: Skin is warm and dry. He is not diaphoretic.  Psychiatric: He has a normal mood and affect. Judgment normal.  Nursing note and vitals reviewed.    ED Treatments / Results  DIAGNOSTIC STUDIES: Oxygen Saturation is 96% on RA, normal by my interpretation.  COORDINATION OF CARE: 3:53 PM-Discussed treatment plan with pt at bedside and pt agreed to plan.   Labs (all labs ordered are listed, but only abnormal results are displayed) Labs Reviewed - No data to display  EKG  EKG Interpretation None       Radiology No results found.  Procedures Procedures (including critical care time)  Medications Ordered in ED Medications - No data to display   Initial Impression / Assessment and Plan / ED Course  I have reviewed  the triage vital signs and the nursing notes.  Pertinent labs & imaging results that were available during my care of the patient were reviewed by me and considered in my medical decision making (see chart for details).  Clinical Course    Pt advised symptomatic care.     Final Clinical Impressions(s) / ED Diagnoses   Final diagnoses:  Viral illness    New Prescriptions Discharge Medication List as of 04/01/2016  4:04 PM    An After Visit Summary was printed and given to the patient.  I personally performed the services in this documentation, which was scribed in my presence.  The  recorded information has been reviewed and considered.   Barnet Pall.   Elson Areas, PA-C 04/01/16 1820 An After Visit Summary was printed and given to the patient.   Lonia Skinner Ethel, PA-C 04/01/16 1826    Loren Racer, MD 04/10/16 1136

## 2016-04-03 IMAGING — CR DG CHEST 2V
2 series · 2 of 2 positions shown · non-contrast
Comparison: None.

CLINICAL DATA: Bilateral rib pain with cough or sneeze, especially
on the right side. Cold symptoms 2 weeks ago. Cough.

EXAM:
CHEST  2 VIEW

[w chest pa]
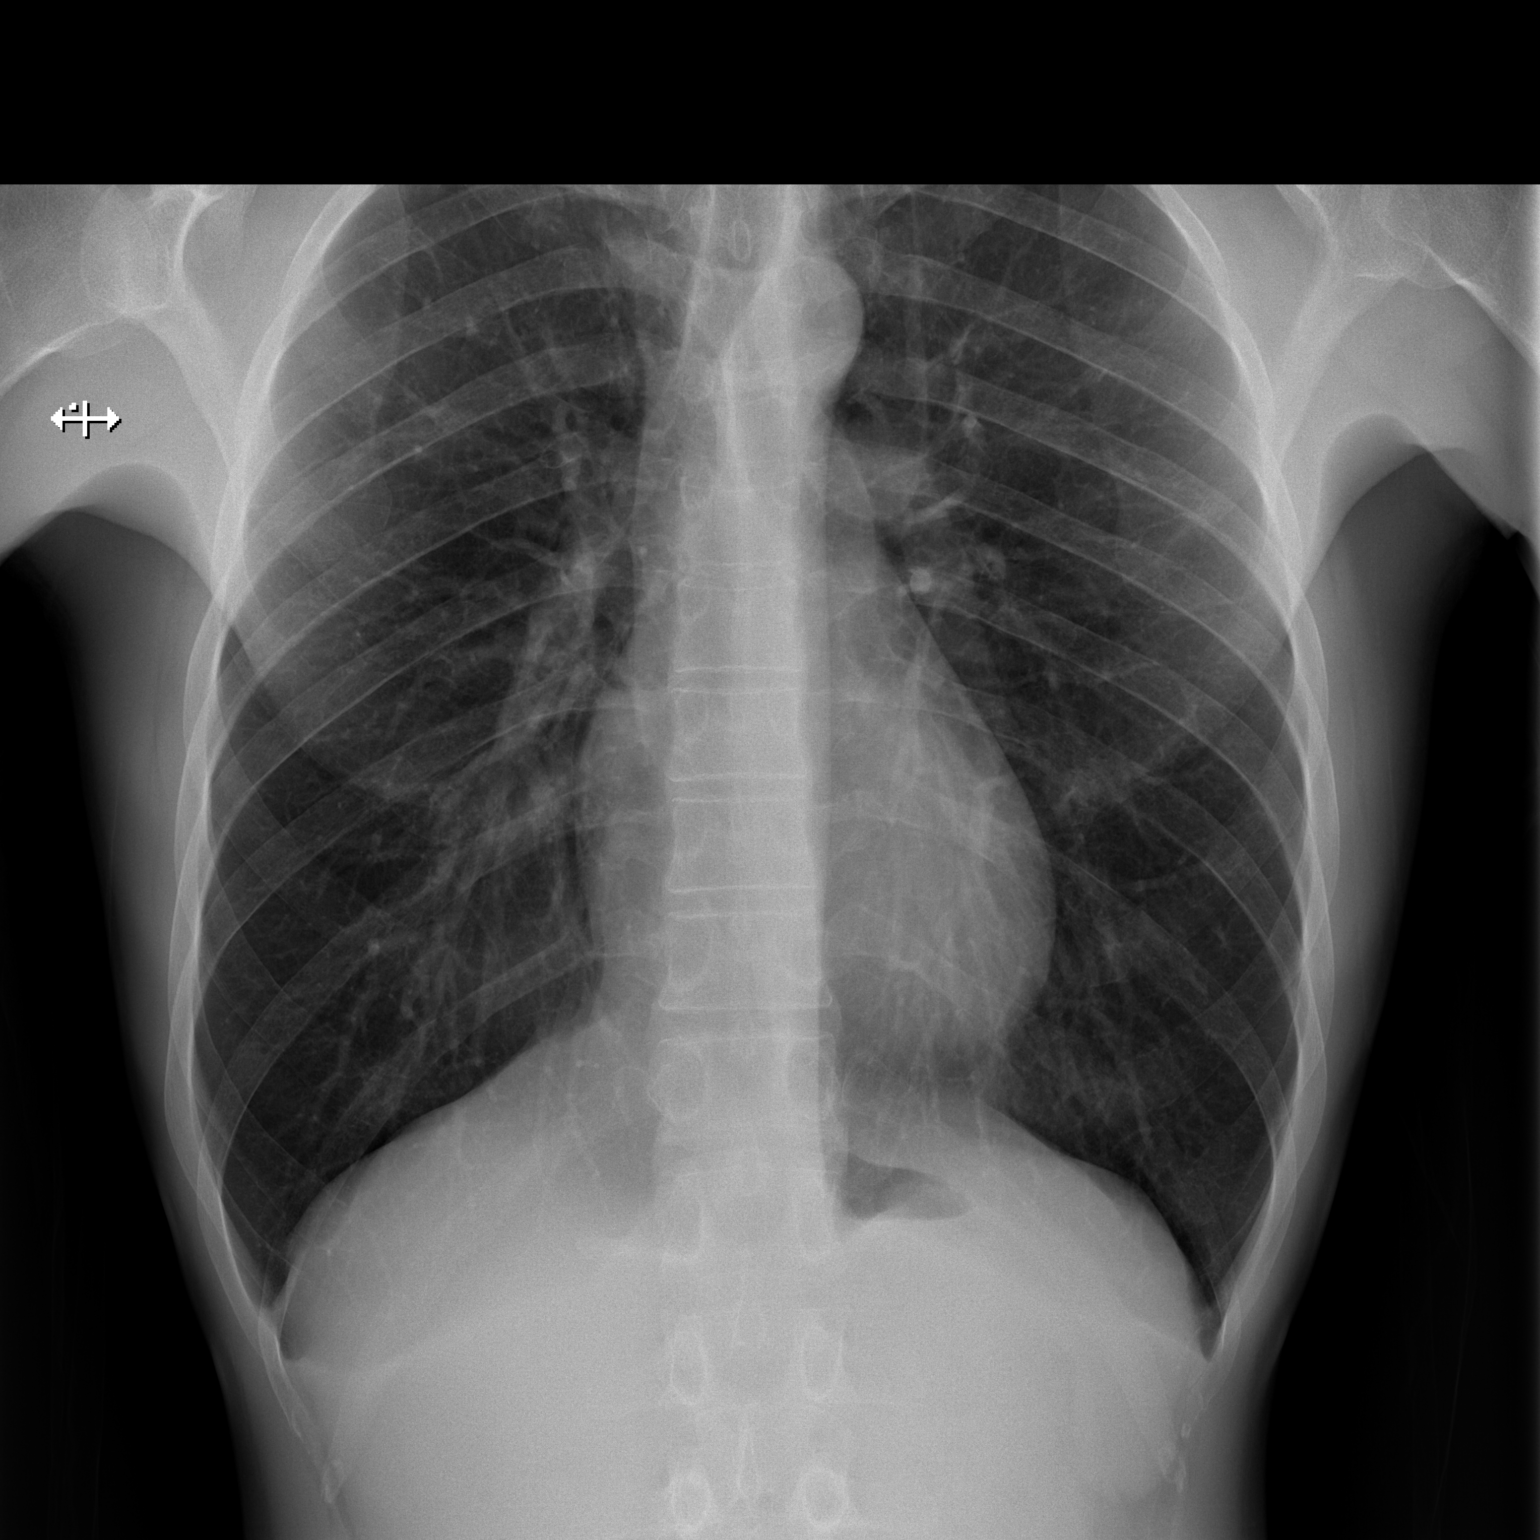

[w chest lat]
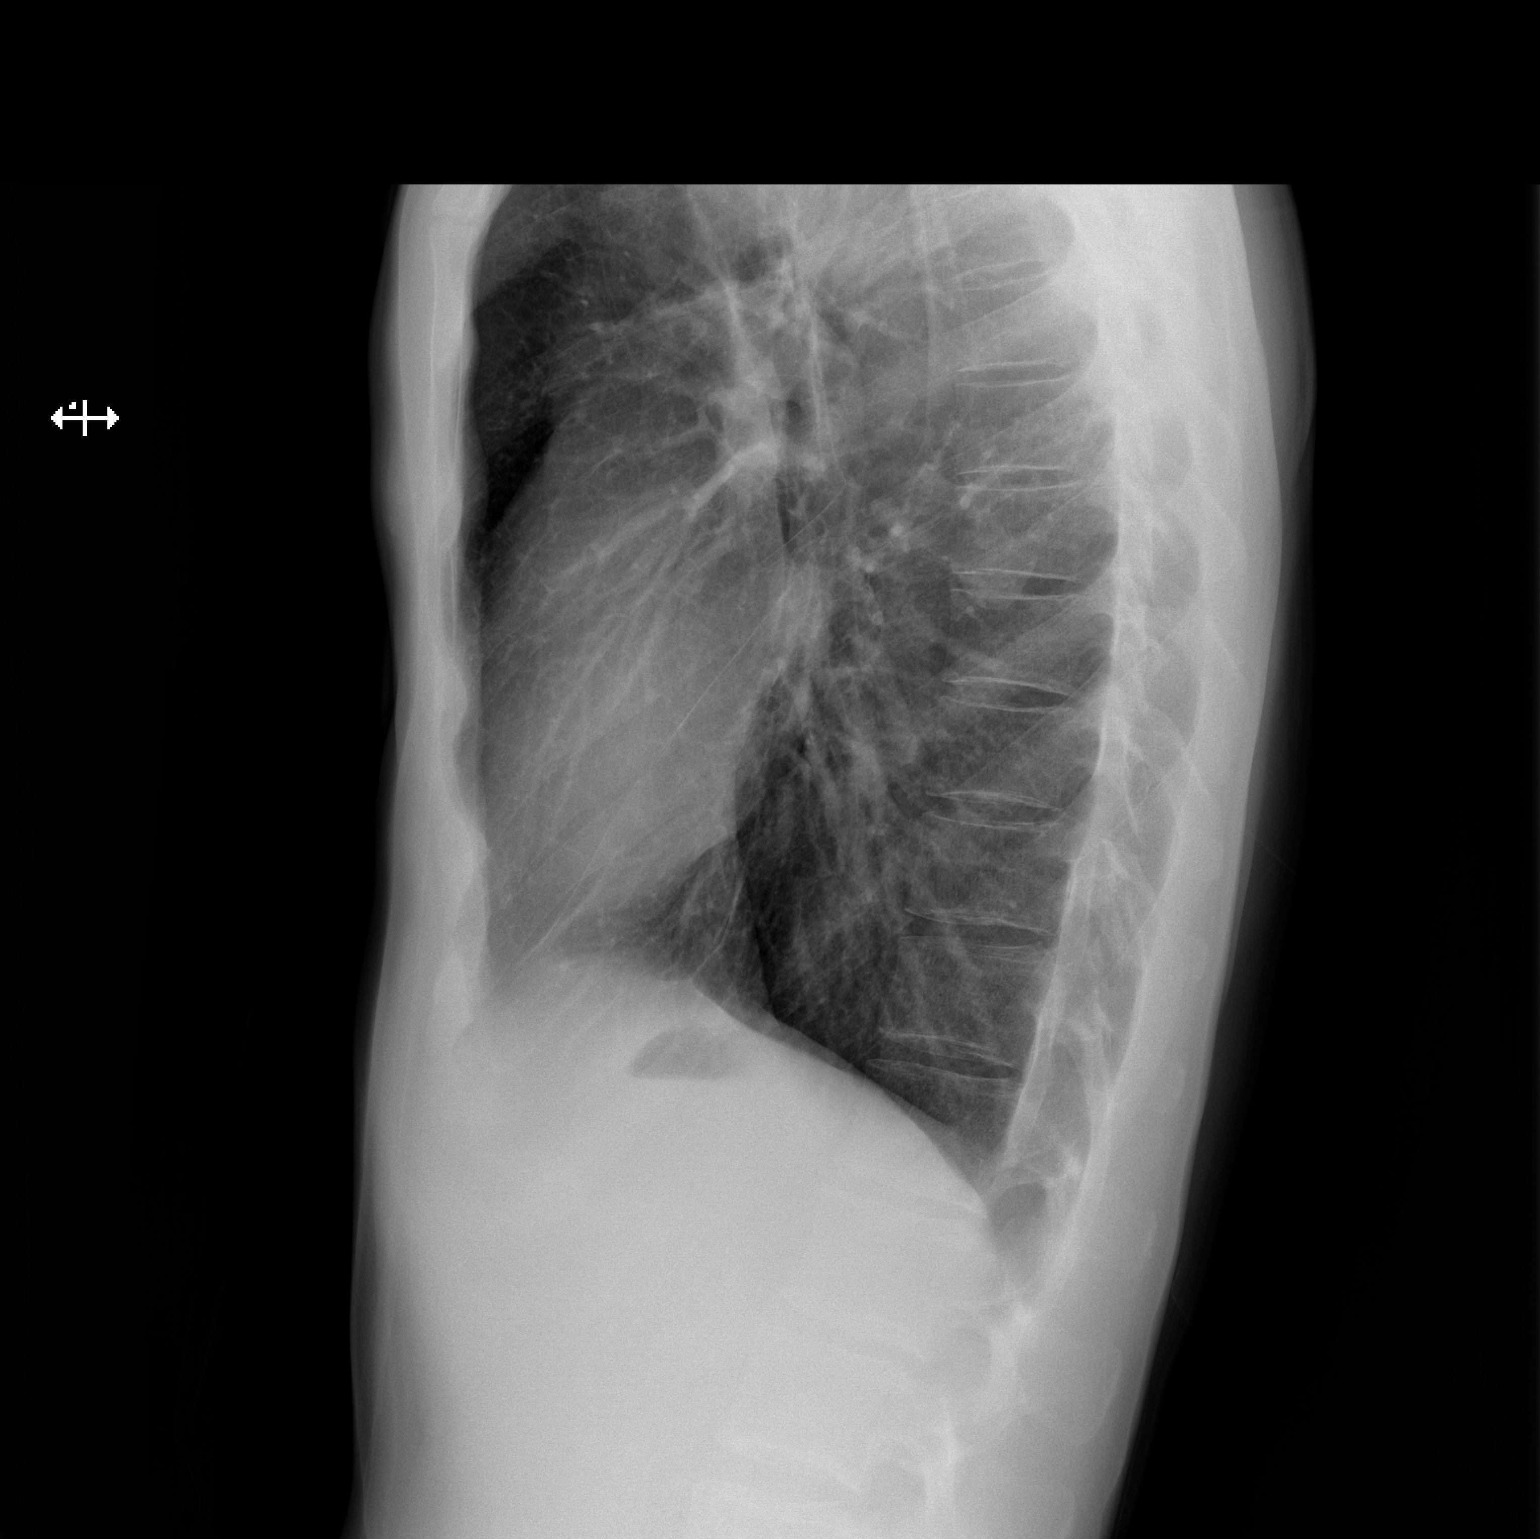

[2 of 2 positions shown; findings below may reference images not displayed]

FINDINGS: Pulmonary hyperinflation. The heart size and mediastinal contours
are within normal limits. Both lungs are clear. The visualized
skeletal structures are unremarkable.
IMPRESSION: No active cardiopulmonary disease.

## 2018-03-18 IMAGING — DX DG FINGER INDEX 2+V*L*
3 series · 3 of 3 positions shown · non-contrast
Comparison: None.

CLINICAL DATA: Hit finger with hammer, bruising and pain

EXAM:
LEFT INDEX FINGER 2+V

[finger ap]
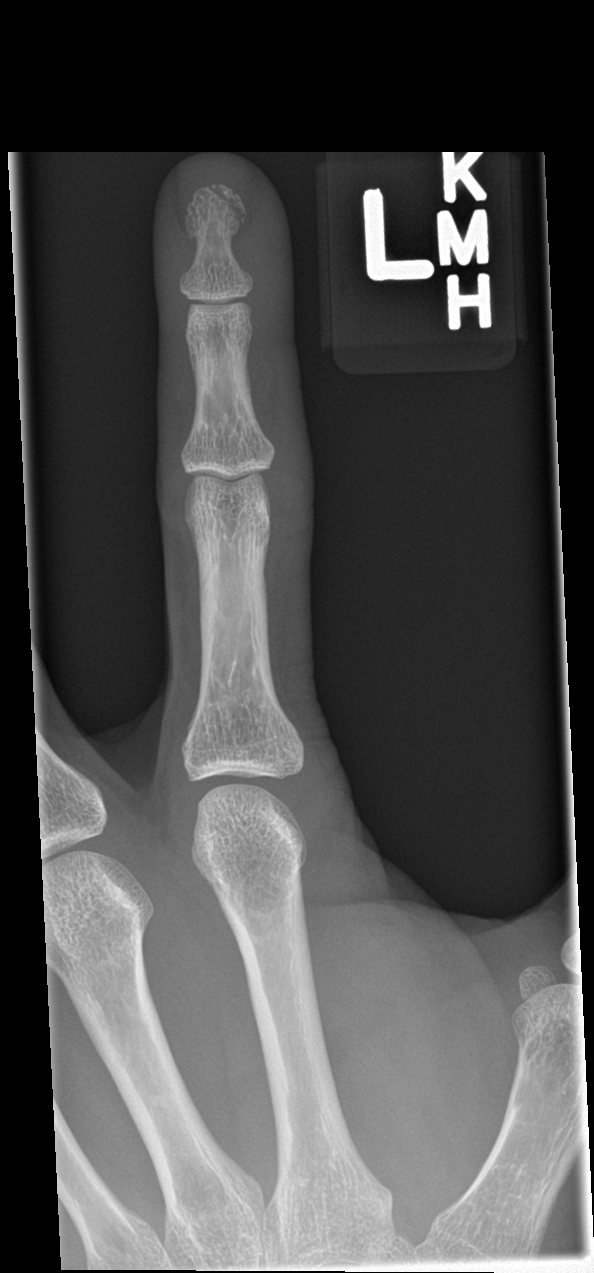

[finger obl]
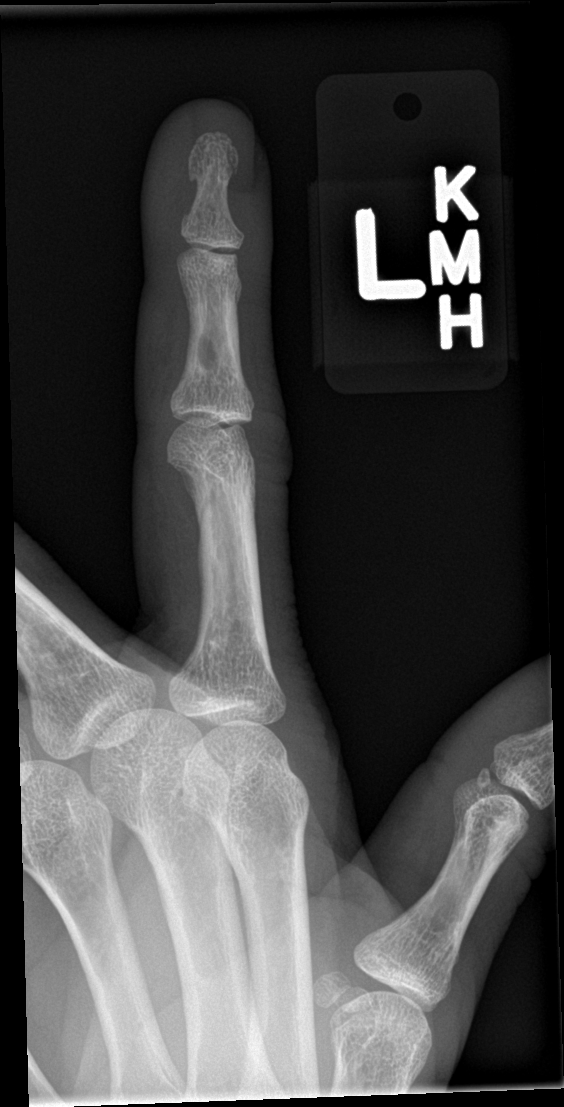

[finger lat]
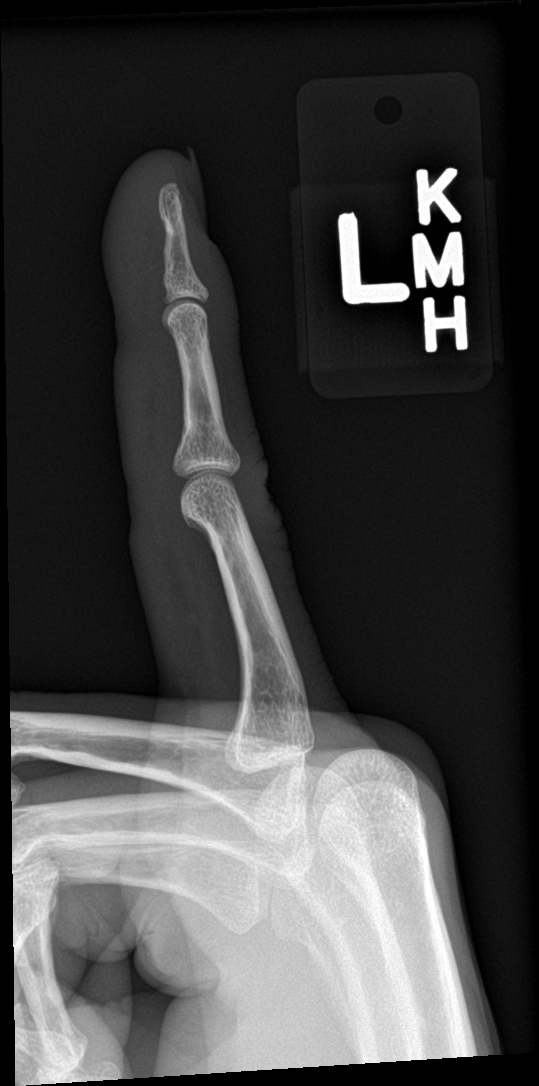

[3 of 3 positions shown; findings below may reference images not displayed]

FINDINGS: There is a mildly comminuted but nondisplaced tuft fracture of the
second distal digit. No radiopaque foreign body.
IMPRESSION: Nondisplaced tuft fracture of the second distal phalanx.

## 2020-06-02 ENCOUNTER — Encounter: Attending: Family Medicine

## 2020-07-24 NOTE — Telephone Encounter (Signed)
Formatting of this note might be different from the original.  ----- Message from Luci Bank sent at 07/23/2020  5:45 PM EDT -----  Subject: Appointment Request    Reason for Call: New Patient Request Appointment    QUESTIONS  Type of Appointment? New Patient/New to Provider  Reason for appointment request? No appointments available during search  Additional Information for Provider? Please call, patient would like to   establish care. Thank you.   ---------------------------------------------------------------------------  --------------  CALL BACK INFO  What is the best way for the office to contact you? OK to leave message on   voicemail  Preferred Call Back Phone Number? [phone]  ---------------------------------------------------------------------------  --------------  SCRIPT ANSWERS  Relationship to Patient? Self  Specialty Confirmation? Primary Care  Is this the first appointment to establish care for a newborn? No  Have you been diagnosed with, awaiting test results for, or told that you   are suspected of having COVID-19 (Coronavirus)? (If patient has tested   negative or was tested as a requirement for work, school, or travel and   not based on symptoms, answer no)? No  Within the past 10 days have you developed any of the following symptoms   (answer ?no? if symptoms have been present longer than 10 days or began   more than 10 days ago)? Fever or Chills, Cough, Shortness of breath or   difficulty breathing, Loss of taste or smell, Sore throat, Nasal   congestion, Sneezing or runny nose, Fatigue or generalized body aches   (answer no if pain is specific to a body part e.g. back pain), Diarrhea,   Headache? No  Have you had close contact with someone with COVID-19 in the last 7 days?   No  (Service Expert - click yes below to proceed with [employer] As Usual   Scheduling)? Yes  Electronically signed by Karis Juba N at 07/24/2020  8:20 AM EDT

## 2020-07-24 NOTE — Telephone Encounter (Signed)
-----   Message from Shona Louder sent at 07/23/2020  5:45 PM EDT -----  Subject: Appointment Request    Reason for Call: New Patient Request Appointment    QUESTIONS  Type of Appointment? New Patient/New to Provider  Reason for appointment request? No appointments available during search  Additional Information for Provider? Please call, patient would like to   establish care. Thank you.   ---------------------------------------------------------------------------  --------------  CALL BACK INFO  What is the best way for the office to contact you? OK to leave message on   voicemail  Preferred Call Back Phone Number? [phone]  ---------------------------------------------------------------------------  --------------  SCRIPT ANSWERS  Relationship to Patient? Self  Specialty Confirmation? Primary Care  Is this the first appointment to establish care for a newborn? No  Have you been diagnosed with, awaiting test results for, or told that you   are suspected of having COVID-19 (Coronavirus)? (If patient has tested   negative or was tested as a requirement for work, school, or travel and   not based on symptoms, answer no)? No  Within the past 10 days have you developed any of the following symptoms   (answer "no" if symptoms have been present longer than 10 days or began   more than 10 days ago)? Fever or Chills, Cough, Shortness of breath or   difficulty breathing, Loss of taste or smell, Sore throat, Nasal   congestion, Sneezing or runny nose, Fatigue or generalized body aches   (answer no if pain is specific to a body part e.g. back pain), Diarrhea,   Headache? No  Have you had close contact with someone with COVID-19 in the last 7 days?   No  (Service Expert - click yes below to proceed with [employer] As Usual   Scheduling)? Yes

## 2020-07-24 NOTE — Telephone Encounter (Signed)
Returned call to make np appt, lvm to call us back.

## 2020-07-24 NOTE — Telephone Encounter (Signed)
Formatting of this note might be different from the original.  Returned call to make np appt, lvm to call us back.   Electronically signed by Karis Juba N at 07/24/2020  8:21 AM EDT

## 2022-07-09 ENCOUNTER — Encounter: Payer: BLUE CROSS/BLUE SHIELD | Attending: Family Medicine

## 2023-01-26 ENCOUNTER — Encounter: Payer: BLUE CROSS/BLUE SHIELD | Attending: Family Medicine

## 2024-04-24 ENCOUNTER — Inpatient Hospital Stay: Admit: 2024-04-24 | Admitting: Internal Medicine

## 2024-04-24 ENCOUNTER — Encounter (HOSPITAL_COMMUNITY): Payer: Self-pay
